# Patient Record
Sex: Male | Born: 1970 | Race: White | Hispanic: No | Smoking: Former smoker
Health system: Southern US, Community
[De-identification: ages and names within clinical notes are randomized; demographics above are authoritative.]

## PROBLEM LIST (undated history)

## (undated) DIAGNOSIS — F32A Depression, unspecified: Secondary | ICD-10-CM

## (undated) DIAGNOSIS — F329 Major depressive disorder, single episode, unspecified: Secondary | ICD-10-CM

## (undated) DIAGNOSIS — Z720 Tobacco use: Secondary | ICD-10-CM

## (undated) DIAGNOSIS — K219 Gastro-esophageal reflux disease without esophagitis: Secondary | ICD-10-CM

## (undated) DIAGNOSIS — F419 Anxiety disorder, unspecified: Secondary | ICD-10-CM

## (undated) DIAGNOSIS — T7840XA Allergy, unspecified, initial encounter: Secondary | ICD-10-CM

## (undated) DIAGNOSIS — I219 Acute myocardial infarction, unspecified: Secondary | ICD-10-CM

## (undated) DIAGNOSIS — I2510 Atherosclerotic heart disease of native coronary artery without angina pectoris: Secondary | ICD-10-CM

## (undated) HISTORY — DX: Acute myocardial infarction, unspecified: I21.9

## (undated) HISTORY — DX: Allergy, unspecified, initial encounter: T78.40XA

## (undated) HISTORY — DX: Gastro-esophageal reflux disease without esophagitis: K21.9

---

## 2015-09-05 ENCOUNTER — Emergency Department (HOSPITAL_COMMUNITY): Payer: Self-pay

## 2015-09-05 ENCOUNTER — Encounter (HOSPITAL_COMMUNITY)
Admission: EM | Disposition: A | Discharge status: Home / Self Care | Payer: Self-pay | Source: Home / Self Care | Attending: Cardiovascular Disease

## 2015-09-05 ENCOUNTER — Encounter (HOSPITAL_COMMUNITY): Payer: Self-pay | Admitting: Emergency Medicine

## 2015-09-05 ENCOUNTER — Inpatient Hospital Stay (HOSPITAL_COMMUNITY)
Admission: EM | Admit: 2015-09-05 | Discharge: 2015-09-07 | DRG: 247 | Disposition: A | Discharge status: Home / Self Care | Payer: Self-pay | Attending: Cardiovascular Disease | Admitting: Cardiovascular Disease

## 2015-09-05 DIAGNOSIS — Z6831 Body mass index (BMI) 31.0-31.9, adult: Secondary | ICD-10-CM

## 2015-09-05 DIAGNOSIS — F17210 Nicotine dependence, cigarettes, uncomplicated: Secondary | ICD-10-CM | POA: Diagnosis present

## 2015-09-05 DIAGNOSIS — Z23 Encounter for immunization: Secondary | ICD-10-CM

## 2015-09-05 DIAGNOSIS — Z955 Presence of coronary angioplasty implant and graft: Secondary | ICD-10-CM

## 2015-09-05 DIAGNOSIS — E669 Obesity, unspecified: Secondary | ICD-10-CM | POA: Diagnosis present

## 2015-09-05 DIAGNOSIS — I255 Ischemic cardiomyopathy: Secondary | ICD-10-CM | POA: Diagnosis present

## 2015-09-05 DIAGNOSIS — Z720 Tobacco use: Secondary | ICD-10-CM | POA: Diagnosis present

## 2015-09-05 DIAGNOSIS — I214 Non-ST elevation (NSTEMI) myocardial infarction: Principal | ICD-10-CM

## 2015-09-05 DIAGNOSIS — Z8249 Family history of ischemic heart disease and other diseases of the circulatory system: Secondary | ICD-10-CM

## 2015-09-05 DIAGNOSIS — I2510 Atherosclerotic heart disease of native coronary artery without angina pectoris: Secondary | ICD-10-CM

## 2015-09-05 DIAGNOSIS — E785 Hyperlipidemia, unspecified: Secondary | ICD-10-CM | POA: Diagnosis present

## 2015-09-05 DIAGNOSIS — I249 Acute ischemic heart disease, unspecified: Secondary | ICD-10-CM

## 2015-09-05 HISTORY — DX: Atherosclerotic heart disease of native coronary artery without angina pectoris: I25.10

## 2015-09-05 HISTORY — PX: CARDIAC CATHETERIZATION: SHX172

## 2015-09-05 HISTORY — DX: Tobacco use: Z72.0

## 2015-09-05 HISTORY — DX: Depression, unspecified: F32.A

## 2015-09-05 HISTORY — DX: Major depressive disorder, single episode, unspecified: F32.9

## 2015-09-05 HISTORY — DX: Anxiety disorder, unspecified: F41.9

## 2015-09-05 LAB — TROPONIN I
TROPONIN I: 0.18 ng/mL — AB (ref <0.031)
Troponin I: >65 ng/mL (ref <0.031)
Troponin I: >65 ng/mL (ref <0.031)

## 2015-09-05 LAB — CBC WITH DIFFERENTIAL/PLATELET
BASOS ABS: 0.1 10*3/uL (ref 0.0–0.1)
BASOS PCT: 1 %
Eosinophils Absolute: 0.2 10*3/uL (ref 0.0–0.7)
Eosinophils Relative: 3 %
HEMATOCRIT: 46.6 % (ref 39.0–52.0)
HEMOGLOBIN: 16.4 g/dL (ref 13.0–17.0)
LYMPHS PCT: 53 %
Lymphs Abs: 4 10*3/uL (ref 0.7–4.0)
MCH: 34.3 pg — ABNORMAL HIGH (ref 26.0–34.0)
MCHC: 35.2 g/dL (ref 30.0–36.0)
MCV: 97.5 fL (ref 78.0–100.0)
MONO ABS: 0.8 10*3/uL (ref 0.1–1.0)
Monocytes Relative: 11 %
NEUTROS ABS: 2.4 10*3/uL (ref 1.7–7.7)
NEUTROS PCT: 32 %
PLATELETS: 196 10*3/uL (ref 150–400)
RBC: 4.78 MIL/uL (ref 4.22–5.81)
RDW: 13.4 % (ref 11.5–15.5)
WBC: 7.5 10*3/uL (ref 4.0–10.5)

## 2015-09-05 LAB — BASIC METABOLIC PANEL
Anion gap: 9 (ref 5–15)
CALCIUM: 9.2 mg/dL (ref 8.9–10.3)
CO2: 24 mmol/L (ref 22–32)
CREATININE: 0.99 mg/dL (ref 0.61–1.24)
Chloride: 104 mmol/L (ref 101–111)
Glucose, Bld: 149 mg/dL — ABNORMAL HIGH (ref 65–99)
Potassium: 3.8 mmol/L (ref 3.5–5.1)
SODIUM: 137 mmol/L (ref 135–145)

## 2015-09-05 LAB — I-STAT TROPONIN, ED: Troponin i, poc: 0 ng/mL (ref 0.00–0.08)

## 2015-09-05 LAB — CBC
HCT: 44.2 % (ref 39.0–52.0)
HEMOGLOBIN: 15.3 g/dL (ref 13.0–17.0)
MCH: 33.8 pg (ref 26.0–34.0)
MCHC: 34.6 g/dL (ref 30.0–36.0)
MCV: 97.8 fL (ref 78.0–100.0)
Platelets: 174 10*3/uL (ref 150–400)
RBC: 4.52 MIL/uL (ref 4.22–5.81)
RDW: 13.5 % (ref 11.5–15.5)
WBC: 8.5 10*3/uL (ref 4.0–10.5)

## 2015-09-05 LAB — PLATELET COUNT: PLATELETS: 172 10*3/uL (ref 150–400)

## 2015-09-05 LAB — BRAIN NATRIURETIC PEPTIDE: B NATRIURETIC PEPTIDE 5: 15.8 pg/mL (ref 0.0–100.0)

## 2015-09-05 LAB — PROTIME-INR
INR: 1.19 (ref 0.00–1.49)
PROTHROMBIN TIME: 15.2 s (ref 11.6–15.2)

## 2015-09-05 LAB — CREATININE, SERUM
Creatinine, Ser: 0.89 mg/dL (ref 0.61–1.24)
GFR calc non Af Amer: >60 mL/min (ref >60)

## 2015-09-05 LAB — POCT ACTIVATED CLOTTING TIME: Activated Clotting Time: 325 seconds

## 2015-09-05 LAB — MRSA PCR SCREENING: MRSA BY PCR: NEGATIVE

## 2015-09-05 SURGERY — LEFT HEART CATH AND CORONARY ANGIOGRAPHY

## 2015-09-05 MED ORDER — INFLUENZA VAC SPLIT QUAD 0.5 ML IM SUSY
0.5000 mL | PREFILLED_SYRINGE | INTRAMUSCULAR | Status: AC
  Administered 2015-09-06: 0.5 mL via INTRAMUSCULAR

## 2015-09-05 MED ORDER — SODIUM CHLORIDE 0.9 % IJ SOLN
3.0000 mL | Freq: Two times a day (BID) | INTRAMUSCULAR | Status: DC
  Administered 2015-09-05 – 2015-09-07 (×3): 3 mL via INTRAVENOUS

## 2015-09-05 MED ORDER — HEPARIN (PORCINE) IN NACL 2-0.9 UNIT/ML-% IJ SOLN
INTRAMUSCULAR | Status: AC
Start: 2015-09-05 — End: 2015-09-05
  Filled 2015-09-05: qty 1000

## 2015-09-05 MED ORDER — ONDANSETRON HCL 4 MG/2ML IJ SOLN: 4.0000 mg | Freq: Four times a day (QID) | INTRAMUSCULAR | Status: DC | PRN

## 2015-09-05 MED ORDER — HEPARIN BOLUS VIA INFUSION
4000.0000 [IU] | Freq: Once | INTRAVENOUS | Status: AC
  Administered 2015-09-05: 4000 [IU] via INTRAVENOUS
  Filled 2015-09-05: qty 4000

## 2015-09-05 MED ORDER — TIROFIBAN HCL IN NACL 5-0.9 MG/100ML-% IV SOLN: 0.1500 ug/kg/min | INTRAVENOUS | Status: AC

## 2015-09-05 MED ORDER — TIROFIBAN HCL IN NACL 5-0.9 MG/100ML-% IV SOLN
INTRAVENOUS | Status: AC
  Filled 2015-09-05: qty 100

## 2015-09-05 MED ORDER — HEPARIN SODIUM (PORCINE) 5000 UNIT/ML IJ SOLN
5000.0000 [IU] | Freq: Three times a day (TID) | INTRAMUSCULAR | Status: DC
  Administered 2015-09-05 – 2015-09-07 (×5): 5000 [IU] via SUBCUTANEOUS
  Filled 2015-09-05 (×5): qty 1

## 2015-09-05 MED ORDER — FENTANYL CITRATE (PF) 100 MCG/2ML IJ SOLN
INTRAMUSCULAR | Status: DC | PRN
  Administered 2015-09-05 (×5): 25 ug via INTRAVENOUS

## 2015-09-05 MED ORDER — TIROFIBAN HCL IN NACL 5-0.9 MG/100ML-% IV SOLN
INTRAVENOUS | Status: DC | PRN
  Administered 2015-09-05: 0.15 ug/kg/min via INTRAVENOUS

## 2015-09-05 MED ORDER — SODIUM CHLORIDE 0.9 % IJ SOLN
3.0000 mL | Freq: Two times a day (BID) | INTRAMUSCULAR | Status: DC
  Administered 2015-09-05 – 2015-09-06 (×2): 3 mL via INTRAVENOUS

## 2015-09-05 MED ORDER — NITROGLYCERIN 1 MG/10 ML FOR IR/CATH LAB
INTRA_ARTERIAL | Status: DC | PRN
  Administered 2015-09-05: 10:00:00

## 2015-09-05 MED ORDER — VERAPAMIL HCL 2.5 MG/ML IV SOLN
INTRAVENOUS | Status: DC | PRN
  Administered 2015-09-05: 09:00:00 via INTRA_ARTERIAL

## 2015-09-05 MED ORDER — METOPROLOL TARTRATE 1 MG/ML IV SOLN
INTRAVENOUS | Status: AC
  Filled 2015-09-05: qty 5

## 2015-09-05 MED ORDER — HEPARIN (PORCINE) IN NACL 100-0.45 UNIT/ML-% IJ SOLN
1350.0000 [IU]/h | INTRAMUSCULAR | Status: DC
  Administered 2015-09-05: 1350 [IU]/h via INTRAVENOUS
  Filled 2015-09-05: qty 250

## 2015-09-05 MED ORDER — ONDANSETRON HCL 4 MG/2ML IJ SOLN
4.0000 mg | Freq: Once | INTRAMUSCULAR | Status: AC
  Administered 2015-09-05: 4 mg via INTRAVENOUS
  Filled 2015-09-05: qty 2

## 2015-09-05 MED ORDER — ATORVASTATIN CALCIUM 80 MG PO TABS: 80.0000 mg | ORAL_TABLET | Freq: Every day | ORAL | Status: DC

## 2015-09-05 MED ORDER — ASPIRIN EC 81 MG PO TBEC: 81.0000 mg | DELAYED_RELEASE_TABLET | Freq: Every day | ORAL | Status: DC

## 2015-09-05 MED ORDER — VERAPAMIL HCL 2.5 MG/ML IV SOLN
INTRAVENOUS | Status: AC
  Filled 2015-09-05: qty 2

## 2015-09-05 MED ORDER — NITROGLYCERIN 1 MG/10 ML FOR IR/CATH LAB
INTRA_ARTERIAL | Status: DC | PRN
  Administered 2015-09-05: 120 ug via INTRA_ARTERIAL
  Administered 2015-09-05: 200 ug via INTRA_ARTERIAL

## 2015-09-05 MED ORDER — ASPIRIN 81 MG PO CHEW
81.0000 mg | CHEWABLE_TABLET | Freq: Every day | ORAL | Status: DC
  Administered 2015-09-06 – 2015-09-07 (×2): 81 mg via ORAL
  Filled 2015-09-05 (×2): qty 1

## 2015-09-05 MED ORDER — MIDAZOLAM HCL 2 MG/2ML IJ SOLN
INTRAMUSCULAR | Status: AC
  Filled 2015-09-05: qty 2

## 2015-09-05 MED ORDER — LIDOCAINE HCL (PF) 1 % IJ SOLN
INTRAMUSCULAR | Status: AC
  Filled 2015-09-05: qty 30

## 2015-09-05 MED ORDER — SODIUM CHLORIDE 0.9 % IJ SOLN: 3.0000 mL | INTRAMUSCULAR | Status: DC | PRN

## 2015-09-05 MED ORDER — SODIUM CHLORIDE 0.9 % IV SOLN: 250.0000 mL | INTRAVENOUS | Status: DC | PRN

## 2015-09-05 MED ORDER — FENTANYL CITRATE (PF) 100 MCG/2ML IJ SOLN
INTRAMUSCULAR | Status: AC
  Filled 2015-09-05: qty 2

## 2015-09-05 MED ORDER — SODIUM CHLORIDE 0.9 % WEIGHT BASED INFUSION: 1.0000 mL/kg/h | INTRAVENOUS | Status: AC

## 2015-09-05 MED ORDER — IOHEXOL 350 MG/ML SOLN
INTRAVENOUS | Status: DC | PRN
  Administered 2015-09-05: 165 mL via INTRA_ARTERIAL

## 2015-09-05 MED ORDER — TICAGRELOR 90 MG PO TABS
ORAL_TABLET | ORAL | Status: AC
  Filled 2015-09-05: qty 1

## 2015-09-05 MED ORDER — HEPARIN SODIUM (PORCINE) 1000 UNIT/ML IJ SOLN
INTRAMUSCULAR | Status: DC | PRN
  Administered 2015-09-05 (×2): 5000 [IU] via INTRAVENOUS

## 2015-09-05 MED ORDER — TICAGRELOR 90 MG PO TABS
ORAL_TABLET | ORAL | Status: DC | PRN
  Administered 2015-09-05: 180 mg via ORAL

## 2015-09-05 MED ORDER — METOPROLOL TARTRATE 1 MG/ML IV SOLN
INTRAVENOUS | Status: DC | PRN
  Administered 2015-09-05: 5 mg via INTRAVENOUS

## 2015-09-05 MED ORDER — ATORVASTATIN CALCIUM 80 MG PO TABS
80.0000 mg | ORAL_TABLET | ORAL | Status: DC
  Administered 2015-09-05: 80 mg via ORAL
  Filled 2015-09-05: qty 1

## 2015-09-05 MED ORDER — ACETAMINOPHEN 325 MG PO TABS: 650.0000 mg | ORAL_TABLET | ORAL | Status: DC | PRN

## 2015-09-05 MED ORDER — TICAGRELOR 90 MG PO TABS
90.0000 mg | ORAL_TABLET | Freq: Two times a day (BID) | ORAL | Status: DC
  Administered 2015-09-05 – 2015-09-07 (×4): 90 mg via ORAL
  Filled 2015-09-05 (×4): qty 1

## 2015-09-05 MED ORDER — TIROFIBAN (AGGRASTAT) BOLUS VIA INFUSION
INTRAVENOUS | Status: DC | PRN
  Administered 2015-09-05: 2552.5 ug via INTRAVENOUS

## 2015-09-05 MED ORDER — MORPHINE SULFATE (PF) 4 MG/ML IV SOLN
4.0000 mg | Freq: Once | INTRAVENOUS | Status: AC
  Administered 2015-09-05: 4 mg via INTRAVENOUS
  Filled 2015-09-05: qty 1

## 2015-09-05 MED ORDER — ENSURE ENLIVE PO LIQD
237.0000 mL | Freq: Two times a day (BID) | ORAL | Status: DC
  Administered 2015-09-07: 237 mL via ORAL

## 2015-09-05 MED ORDER — MIDAZOLAM HCL 2 MG/2ML IJ SOLN
INTRAMUSCULAR | Status: DC | PRN
  Administered 2015-09-05: 1 mg via INTRAVENOUS
  Administered 2015-09-05: 2 mg via INTRAVENOUS
  Administered 2015-09-05 (×3): 1 mg via INTRAVENOUS

## 2015-09-05 MED ORDER — SODIUM CHLORIDE 0.9 % IV SOLN: INTRAVENOUS | Status: DC

## 2015-09-05 MED ORDER — NITROGLYCERIN 0.4 MG SL SUBL
0.4000 mg | SUBLINGUAL_TABLET | SUBLINGUAL | Status: DC | PRN
  Administered 2015-09-05 (×3): 0.4 mg via SUBLINGUAL
  Filled 2015-09-05: qty 1

## 2015-09-05 MED ORDER — NITROGLYCERIN 0.4 MG SL SUBL: 0.4000 mg | SUBLINGUAL_TABLET | SUBLINGUAL | Status: DC | PRN

## 2015-09-05 MED ORDER — PNEUMOCOCCAL VAC POLYVALENT 25 MCG/0.5ML IJ INJ
0.5000 mL | INJECTION | INTRAMUSCULAR | Status: AC
  Administered 2015-09-06: 0.5 mL via INTRAMUSCULAR

## 2015-09-05 MED ORDER — HEPARIN SODIUM (PORCINE) 1000 UNIT/ML IJ SOLN
INTRAMUSCULAR | Status: AC
  Filled 2015-09-05: qty 1

## 2015-09-05 MED ORDER — SODIUM CHLORIDE 0.9 % IJ SOLN
3.0000 mL | INTRAMUSCULAR | Status: DC | PRN
Start: 2015-09-05 — End: 2015-09-06

## 2015-09-05 MED ORDER — METOPROLOL TARTRATE 12.5 MG HALF TABLET
12.5000 mg | ORAL_TABLET | Freq: Two times a day (BID) | ORAL | Status: DC
  Administered 2015-09-05 – 2015-09-07 (×5): 12.5 mg via ORAL
  Filled 2015-09-05 (×5): qty 1

## 2015-09-05 MED ORDER — NITROGLYCERIN IN D5W 200-5 MCG/ML-% IV SOLN
0.0000 ug/min | Freq: Once | INTRAVENOUS | Status: AC
  Administered 2015-09-05: 5 ug/min via INTRAVENOUS
  Filled 2015-09-05: qty 250

## 2015-09-05 SURGICAL SUPPLY — 22 items
BALLN EMERGE MR 2.5X12 (BALLOONS) ×4
BALLN ~~LOC~~ TREK RX 4.0X8 (BALLOONS) ×4
BALLOON EMERGE MR 2.5X12 (BALLOONS) ×2 IMPLANT
BALLOON ~~LOC~~ TREK RX 4.0X8 (BALLOONS) ×2 IMPLANT
CATH EXTRAC PRONTO 5.5F 138CM (CATHETERS) ×4 IMPLANT
CATH INFINITI 5 FR JL3.5 (CATHETERS) ×4 IMPLANT
CATH INFINITI 5FR ANG PIGTAIL (CATHETERS) ×4 IMPLANT
CATH INFINITI JR4 5F (CATHETERS) ×4 IMPLANT
DEVICE RAD COMP TR BAND LRG (VASCULAR PRODUCTS) ×4 IMPLANT
GLIDESHEATH SLEND SS 6F .021 (SHEATH) ×4 IMPLANT
GUIDE CATH RUNWAY 6FR CLS3 (CATHETERS) ×4 IMPLANT
KIT ENCORE 26 ADVANTAGE (KITS) ×4 IMPLANT
KIT HEART LEFT (KITS) ×4 IMPLANT
PACK CARDIAC CATHETERIZATION (CUSTOM PROCEDURE TRAY) ×4 IMPLANT
STENT SYNERGY DES 3.5X16 (Permanent Stent) ×4 IMPLANT
SYR MEDRAD MARK V 150ML (SYRINGE) ×4 IMPLANT
TRANSDUCER W/STOPCOCK (MISCELLANEOUS) ×4 IMPLANT
TUBING CIL FLEX 10 FLL-RA (TUBING) ×4 IMPLANT
WIRE ASAHI PROWATER 180CM (WIRE) ×4 IMPLANT
WIRE HI TORQ BMW 190CM (WIRE) ×4 IMPLANT
WIRE HI TORQ VERSACORE-J 145CM (WIRE) ×4 IMPLANT
WIRE SAFE-T 1.5MM-J .035X260CM (WIRE) ×4 IMPLANT

## 2015-09-05 NOTE — H&P (Signed)
History and Physical  Patient ID: Chad Copeland MRN: [id number], SOB: [birth year] 44 y.o. Date of Encounter: 09/05/2015, 7:42 AM  Primary Physician: No primary care provider on file. Primary Cardiologist: new Excell Seltzer)  Chief Complaint: Chest pain  HPI: 44 y.o. male w/ PMHx significant for depression, tobacco abuse who presented to [facility] on 09/05/2015 with complaints of chest pain.  The patient has no history of cardiac disease. He awoke at 4 AM today with substernal chest pressure and tingling in his arms. He has never had symptoms like this before. He has been in his usual state of health over the last few weeks, with the exception of sinus congestion that he related to seasonal allergies. Associated symptoms this morning included nausea and diaphoresis. There is no shortness of breath, lightheadedness, or syncope. EMS was called and he was brought to the emergency department. EKG is suggestive of ischemia but was not diagnostic for STEMI. The patient was started on IV heparin and nitroglycerin. His pain initially was rated at 6/10 and now is rated at 2/10. He does have ongoing discomfort at the time of my interview. He describes his chest pain as a "pressure." There is no radiation of pain into the neck, jaw, or shoulders.   Past Medical History  Diagnosis Date  . Depression   . Anxiety      Surgical History: History reviewed. No pertinent past surgical history.   Home Meds: Prior to Admission medications   Not on File    Allergies: No Known Allergies  Social History   Social History  . Marital Status: Divorced    Spouse Name: N/A  . Number of Children: N/A  . Years of Education: N/A   Occupational History  . Not on file.   Social History Main Topics  . Smoking status: Current Every Day Smoker -- 1.00 packs/day    Types: Cigarettes  . Smokeless tobacco: Not on file  . Alcohol Use: 3.6 oz/week    6 Cans of beer per week  . Drug Use: No  . Sexual  Activity: Not on file   Other Topics Concern  . Not on file   Social History Narrative  . No narrative on file     Grandfather on paternal side had multivessel CABG at age 7. No other family history of CAD.  Review of Systems: General: negative for weight changes.  ENT: negative for rhinorrhea or epistaxis Cardiovascular: See history of present illness Dermatological: negative for rash Respiratory: negative for cough or wheezing GI: negative for vomiting, diarrhea, bright red blood per rectum, melena, or hematemesis GU: no hematuria, urgency, or frequency Neurologic: negative for visual changes, syncope, headache, or dizziness Heme: no easy bruising or bleeding Endo: negative for excessive thirst, thyroid disorder, or flushing Musculoskeletal: negative for joint pain or swelling, negative for myalgias All other systems reviewed and are otherwise negative except as noted above.  Physical Exam: Blood pressure 138/95, pulse 83, temperature 98 F (36.7 C), temperature source Oral, resp. rate 17, height 5' 10.5" (1.791 m), weight 225 lb (102.059 kg), SpO2 100 %. General: Well developed, well nourished, pleasant overweight male, alert and oriented, in no acute distress. HEENT: Normocephalic, atraumatic, sclera anicteric Neck: Supple. Carotids 2+ without bruits. JVP normal Lungs: Clear bilaterally to auscultation without wheezes, rales, or rhonchi. Breathing is unlabored. Heart: RRR with normal S1 and S2. No murmurs, rubs, or gallops appreciated. Abdomen: Soft, non-tender, non-distended with normoactive bowel sounds. No hepatomegaly. No rebound/guarding. No obvious abdominal  masses. Back: No CVA tenderness Msk:  Strength and tone appear normal for age. Extremities: No clubbing, cyanosis, or edema.  Distal pedal pulses are 2+ and equal bilaterally. Neuro: CNII-XII intact, moves all extremities spontaneously. Psych:  Responds to questions appropriately with a normal affect. Skin: warm  and dry without rash   Labs:   Lab Results  Component Value Date   WBC 7.5 09/05/2015   HGB 16.4 09/05/2015   HCT 46.6 09/05/2015   MCV 97.5 09/05/2015   PLT 196 09/05/2015    Recent Labs Lab 09/05/15 0527  NA 137  K 3.8  CL 104  CO2 24  BUN <5*  CREATININE 0.99  CALCIUM 9.2  GLUCOSE 149*    Recent Labs  09/05/15 0527  TROPONINI 0.18*   No results found for: CHOL, HDL, LDLCALC, TRIG No results found for: DDIMER  Radiology/Studies:  Dg Chest 2 View  09/05/2015  CLINICAL DATA:  Acute onset of cough and shortness of breath. Generalized chest pain. Follow-up chest radiograph suggested to assess for airspace opacification. Initial encounter. EXAM: CHEST  2 VIEW COMPARISON:  Chest radiograph performed earlier today at 4:53 a.m. FINDINGS: The lungs are well-aerated and clear. There is no evidence of focal opacification, pleural effusion or pneumothorax. The heart is normal in size; the mediastinal contour is within normal limits. No acute osseous abnormalities are seen. IMPRESSION: No acute cardiopulmonary process seen. Electronically Signed   By: Roanna RaiderJeffery  Chang M.D.   On: 09/05/2015 06:29   Dg Chest Port 1 View  09/05/2015  CLINICAL DATA:  Initial evaluation for acute chest pain with shortness of breath, cough, congestion. EXAM: PORTABLE CHEST 1 VIEW COMPARISON:  None. FINDINGS: Cardiac and mediastinal silhouettes are within normal limits. Tracheal air column midline and patent. Lungs are mildly hypoinflated. Secondary mild bronchovascular crowding. Minimal patchy bibasilar opacities favored to reflect atelectasis/ bronchovascular crowding. No definite focal infiltrates identified. No pulmonary edema or pleural effusion. No pneumothorax. No acute osseus abnormality. IMPRESSION: Shallow lung inflation with associated bibasilar patchy opacities. Findings favored to reflect atelectasis/ bronchovascular crowding. No definite focal infiltrates identified. If there is clinical concern for  possible occult infection at either lung base, correlation with a follow-up lateral radiograph may be helpful for further evaluation. Electronically Signed   By: Rise MuBenjamin  McClintock M.D.   On: 09/05/2015 05:18     EKG: NSR, ST-T changes consider anteroseptal ischemia. Slight inferolateral ST elevation (<1 mm) consider acute injury  CARDIAC STUDIES: pending  ASSESSMENT AND PLAN:  1. Acute non-ST elevation MI: The patient has been appropriately started on heparin and nitroglycerin. I have reviewed his serial EKGs. I suspect inferolateral MI, but EKG does not meet criteria for STEMI. He does have ongoing discomfort and I have recommended emergency cardiac catheterization and possible PCI. I have reviewed the risks, indications, and alternatives to cardiac catheterization and PCI with the patient. Risks include but are not limited to bleeding, infection, vascular injury, stroke, myocardial infection, arrhythmia, kidney injury, radiation-related injury in the case of prolonged fluoroscopy use, emergency cardiac surgery, and death. The patient understands the risks of serious complication is low (<1%). He will be continued on heparin and NTG, await cath results for further disposition.   2. Tobacco abuse: cessation counseling done.   3. Obesity: lifestyle modification, check lipids, start high-intensity statin drug.  Enzo BiSigned, Briseyda Fehr, Fredrico MD 09/05/2015, 7:42 AM

## 2015-09-05 NOTE — Progress Notes (Signed)
CRITICAL VALUE ALERT  Critical value received:  Troponin >65  Date of notification:  09/05/15  Time of notification:  1532  Critical value read back:Yes.    Nurse who received alert:  Peyton Bottomsachel R Mikell Camp, RN  MD notified (1st page):  Dr. Eldridge DaceVaranasi  Time of first page:  1613  MD notified (2nd page):  Time of second page:  Responding MD:  Dr. Eldridge DaceVaranasi  Time MD responded:  [phone]

## 2015-09-05 NOTE — Care Management Note (Signed)
Case Management Note  Patient Details  Name: Chad Copeland MRN: [id number] Date of Birth: 1971/05/31  Subjective/Objective:     Adm w mi               Action/Plan: lives alone   Expected Discharge Date:                  Expected Discharge Plan:  Home/Self Care  In-House Referral:     Discharge planning Services  CM Consult, Medication Assistance, Indigent Health Clinic  Post Acute Care Choice:    Choice offered to:     DME Arranged:    DME Agency:     HH Arranged:    HH Agency:     Status of Service:     Medicare Important Message Given:    Date Medicare IM Given:    Medicare IM give by:    Date Additional Medicare IM Given:    Additional Medicare Important Message give by:     If discussed at Long Length of Stay Meetings, dates discussed:    Additional Comments: no ins . Gave pt inform on [facility] and [facility]. Gave pt 30day free brilinta card. Placed brilinta pt assist form on shadow chart. Pt aware will need to send in w proof of income.  Hanley Haysowell, Donley Harland T, RN 09/05/2015, 10:52 AM

## 2015-09-05 NOTE — Progress Notes (Signed)
ANTICOAGULATION CONSULT NOTE - Initial Consult  Pharmacy Consult for Heparin Indication: chest pain/ACS  No Known Allergies  Patient Measurements: Height: 5' 10.5" (179.1 cm) Weight: 225 lb (102.059 kg) IBW/kg (Calculated) : 74.15 Heparin Dosing Weight: 95 kg  Vital Signs: Temp: 98 F (36.7 C) (11/22 0444) Temp Source: Oral (11/22 0444) BP: 134/96 mmHg (11/22 0600) Pulse Rate: 80 (11/22 0600)  Labs:  Recent Labs  09/05/15 0527  HGB 16.4  HCT 46.6  PLT 196  CREATININE 0.99  TROPONINI 0.18*    Estimated Creatinine Clearance: 113.8 mL/min (by C-G formula based on Cr of 0.99).   Medical History: Past Medical History  Diagnosis Date  . Depression   . Anxiety     Medications:  No PTA meds  Assessment: 44 y.o. M presents with CP. To begin heparin for ACS. Trop 0.18. CBC stable at baseline.  Goal of Therapy:  Heparin level 0.3-0.7 units/ml Monitor platelets by anticoagulation protocol: Yes   Plan:  Heparin IV bolus 4000 units Heparin gtt at 1350 units/hr Will f/u heparin level in 6 hours Daily heparin level and CBC  Christoper Fabianaron Signe Tackitt, PharmD, BCPS Clinical pharmacist, pager [phone] 09/05/2015,6:23 AM

## 2015-09-05 NOTE — Progress Notes (Signed)
CRITICAL VALUE ALERT  Critical value received:  Troponin   Date of notification:  09/05/15  Time of notification:  1100  Critical value read back: yes  Nurse who received alert: P. Renae GlossShelton  MD notified (1st page):  Dr Harl BowieVarnansi  Time of first page:    MD notified (2nd page):  Time of second page:  Responding MD: expected lab value  Time MD responded:

## 2015-09-05 NOTE — Interval H&P Note (Signed)
Cath Lab Visit (complete for each Cath Lab visit)  Clinical Evaluation Leading to the Procedure:   ACS: Yes.    Non-ACS:    Anginal Classification: CCS IV  Anti-ischemic medical therapy: 1 medication  Non-Invasive Test Results: No non-invasive testing performed  Prior CABG: No previous CABG  No bleeding issues or planned surgery.  Patient brought urgently to the cath lab due to ongoing pain.    History and Physical Interval Note:  09/05/2015 8:24 AM  Chad Copeland  has presented today for surgery, with the diagnosis of cp  The various methods of treatment have been discussed with the patient and family. After consideration of risks, benefits and other options for treatment, the patient has consented to  Procedure(s): Left Heart Cath and Coronary Angiography (N/A) as a surgical intervention .  The patient's history has been reviewed, patient examined, no change in status, stable for surgery.  I have reviewed the patient's chart and labs.  Questions were answered to the patient's satisfaction.     Chad Copeland S.

## 2015-09-05 NOTE — ED Provider Notes (Signed)
CSN: [id number]     Arrival date & time 09/05/15  [id number] History   First MD Initiated Contact with Patient 09/05/15 [phone]     Chief Complaint  Patient presents with  . Chest Pain  . Shortness of Breath     (Consider location/radiation/quality/duration/timing/severity/associated sxs/prior Treatment) HPI Comments: Presents to the emergency department for evaluation of chest pain. Patient reports that he was awakened from sleep approximately an hour ago. Patient noticed that he had discomfort and tightness in the center of his chest. He then noted nausea, diaphoresis and "cold sweats. He feels short of breath. He comes to the ER by ambulance. He has been given aspirin and nitroglycerin. Patient reports improvement but not complete resolution of his discomfort with nitroglycerin.  Patient has had nasal congestion and nonproductive cough over the last couple of days. He denies any history of asthma or COPD.  Patient denies previous cardiac history. He reports that his blood pressure has been high the last few times it has been checked, but has never been treated for hypertension. He is unaware of his cholesterol status. He does not have diabetes. He reports that one of his grandfathers had coronary artery disease, no early or close relative CAD, however.  Patient is a 44 y.o. male presenting with chest pain and shortness of breath.  Chest Pain Associated symptoms: cough, diaphoresis, nausea and shortness of breath   Shortness of Breath Associated symptoms: chest pain, cough and diaphoresis     Past Medical History  Diagnosis Date  . Depression   . Anxiety    History reviewed. No pertinent past surgical history. History reviewed. No pertinent family history. Social History  Substance Use Topics  . Smoking status: Current Every Day Smoker -- 1.00 packs/day    Types: Cigarettes  . Smokeless tobacco: None  . Alcohol Use: 3.6 oz/week    6 Cans of beer per week    Review of Systems   Constitutional: Positive for diaphoresis.  Respiratory: Positive for cough and shortness of breath.   Cardiovascular: Positive for chest pain.  Gastrointestinal: Positive for nausea.  All other systems reviewed and are negative.     Allergies  Review of patient's allergies indicates no known allergies.  Home Medications   Prior to Admission medications   Not on File   BP 138/95 mmHg  Pulse 83  Temp(Src) 98 F (36.7 C) (Oral)  Resp 17  Ht 5' 10.5" (1.791 m)  Wt 225 lb (102.059 kg)  BMI 31.82 kg/m2  SpO2 100% Physical Exam  Constitutional: He is oriented to person, place, and time. He appears well-developed and well-nourished. No distress.  HENT:  Head: Normocephalic and atraumatic.  Right Ear: Hearing normal.  Left Ear: Hearing normal.  Nose: Nose normal.  Mouth/Throat: Oropharynx is clear and moist and mucous membranes are normal.  Eyes: Conjunctivae and EOM are normal. Pupils are equal, round, and reactive to light.  Neck: Normal range of motion. Neck supple.  Cardiovascular: Regular rhythm, S1 normal and S2 normal.  Exam reveals no gallop and no friction rub.   No murmur heard. Pulmonary/Chest: Effort normal. No respiratory distress. He has rhonchi in the right lower field. He exhibits no tenderness.  Abdominal: Soft. Normal appearance and bowel sounds are normal. There is no hepatosplenomegaly. There is no tenderness. There is no rebound, no guarding, no tenderness at McBurney's point and negative Murphy's sign. No hernia.  Musculoskeletal: Normal range of motion.  Neurological: He is alert and oriented to person, place, and time.  He has normal strength. No cranial nerve deficit or sensory deficit. Coordination normal. GCS eye subscore is 4. GCS verbal subscore is 5. GCS motor subscore is 6.  Skin: Skin is warm, dry and intact. No rash noted. No cyanosis.  Psychiatric: He has a normal mood and affect. His speech is normal and behavior is normal. Thought content normal.   Nursing note and vitals reviewed.   ED Course  Procedures (including critical care time) Labs Review Labs Reviewed  CBC WITH DIFFERENTIAL/PLATELET - Abnormal; Notable for the following:    MCH 34.3 (*)    All other components within normal limits  BASIC METABOLIC PANEL - Abnormal; Notable for the following:    Glucose, Bld 149 (*)    BUN <5 (*)    All other components within normal limits  TROPONIN I - Abnormal; Notable for the following:    Troponin I 0.18 (*)    All other components within normal limits  BRAIN NATRIURETIC PEPTIDE  HEPARIN LEVEL (UNFRACTIONATED)  Rosezena Sensor, ED    Imaging Review Dg Chest 2 View  09/05/2015  CLINICAL DATA:  Acute onset of cough and shortness of breath. Generalized chest pain. Follow-up chest radiograph suggested to assess for airspace opacification. Initial encounter. EXAM: CHEST  2 VIEW COMPARISON:  Chest radiograph performed earlier today at 4:53 a.m. FINDINGS: The lungs are well-aerated and clear. There is no evidence of focal opacification, pleural effusion or pneumothorax. The heart is normal in size; the mediastinal contour is within normal limits. No acute osseous abnormalities are seen. IMPRESSION: No acute cardiopulmonary process seen. Electronically Signed   By: Roanna Raider M.D.   On: 09/05/2015 06:29   Dg Chest Port 1 View  09/05/2015  CLINICAL DATA:  Initial evaluation for acute chest pain with shortness of breath, cough, congestion. EXAM: PORTABLE CHEST 1 VIEW COMPARISON:  None. FINDINGS: Cardiac and mediastinal silhouettes are within normal limits. Tracheal air column midline and patent. Lungs are mildly hypoinflated. Secondary mild bronchovascular crowding. Minimal patchy bibasilar opacities favored to reflect atelectasis/ bronchovascular crowding. No definite focal infiltrates identified. No pulmonary edema or pleural effusion. No pneumothorax. No acute osseus abnormality. IMPRESSION: Shallow lung inflation with associated  bibasilar patchy opacities. Findings favored to reflect atelectasis/ bronchovascular crowding. No definite focal infiltrates identified. If there is clinical concern for possible occult infection at either lung base, correlation with a follow-up lateral radiograph may be helpful for further evaluation. Electronically Signed   By: Rise Mu M.D.   On: 09/05/2015 05:18   I have personally reviewed and evaluated these images and lab results as part of my medical decision-making.   EKG Interpretation   Date/Time:  Tuesday September 05 2015 04:40:09 EST Ventricular Rate:  80 PR Interval:  145 QRS Duration: 79 QT Interval:  408 QTC Calculation: 471 R Axis:   57 Text Interpretation:  Sinus rhythm Abnormal R-wave progression, early  transition Borderline repolarization abnormality Borderline ST elevation,  lateral leads No previous tracing Confirmed by Ajaya Crutchfield  MD, Rhea Thrun  [phone]) on 09/05/2015 4:48:37 AM    EKG Interpretation  Date/Time:  Tuesday September 05 2015 05:07:40 EST Ventricular Rate:  84 PR Interval:  149 QRS Duration: 79 QT Interval:  391 QTC Calculation: 462 R Axis:   42 Text Interpretation:  Sinus rhythm Abnormal R-wave progression, early transition Borderline repolarization abnormality Minimal ST elevation, inferior leads minimal ST depression septal leads No significant change since last tracing Confirmed by Yazan Gatling  MD, Tyla Burgner [phone]) on 09/05/2015 5:10:34 AM  MDM   Final diagnoses:  ACS (acute coronary syndrome) (HCC)   Presents to the emergency department for evaluation of chest pain. Patient had acute onset of chest pain and hour before arriving to the ER. Pain awakened him from sleep. He had concomitant nausea, diaphoresis and shortness of breath. Patient appeared slightly uncomfortable at arrival. He was administered aspirin and nitroglycerin by EMS. Pain improved but did not resolve with nitroglycerin. He was starting to have  increased pain again here in the ER, was administered nitroglycerin with improvement.  Patient's EKG at arrival was not diagnostic. Was concerning. He does have lateral ST elevations which were felt to be early repolarization. He also, however, has depressions in V2 and V3 with prominent R waves, possibly suggestive of posterior injury. This was discussed with cardiology fellow, Dr. Zachery Conch at arrival. He did review the EKG and felt that the patient could be worked up with troponins because EKG was not diagnostic. Patient did, however, ultimately have a positive troponin. Case discussed with Dr. Excell Seltzer, on call for cardiology. He will see the patient in the ER for management. Patient initiated on heparin and nitroglycerin.  CRITICAL CARE Performed by: Gilda Crease   Total critical care time: 30 minutes  Critical care time was exclusive of separately billable procedures and treating other patients.  Critical care was necessary to treat or prevent imminent or life-threatening deterioration.  Critical care was time spent personally by me on the following activities: development of treatment plan with patient and/or surrogate as well as nursing, discussions with consultants, evaluation of patient's response to treatment, examination of patient, obtaining history from patient or surrogate, ordering and performing treatments and interventions, ordering and review of laboratory studies, ordering and review of radiographic studies, pulse oximetry and re-evaluation of patient's condition.      Gilda Crease, MD 09/05/15 [phone]

## 2015-09-05 NOTE — ED Notes (Signed)
Pt brought to ED by [facility] from home for mid CP and SOB with some nausea, pt states CP is 4/10 after 2 nitros given by [facility] 325 mg of Aspirin and 4 mg of Zofran IV. Vitals for [facility] BP145/100, HR 78, 96% on RA.

## 2015-09-05 NOTE — ED Notes (Signed)
Patient transported to X-ray 

## 2015-09-05 NOTE — Progress Notes (Signed)
Right radial TR band removed and pressure dressing applied. Level 1 due to a small amount of bruising.

## 2015-09-06 ENCOUNTER — Inpatient Hospital Stay (HOSPITAL_COMMUNITY): Payer: Self-pay

## 2015-09-06 DIAGNOSIS — I2510 Atherosclerotic heart disease of native coronary artery without angina pectoris: Secondary | ICD-10-CM

## 2015-09-06 LAB — CBC
HEMATOCRIT: 45.4 % (ref 39.0–52.0)
HEMOGLOBIN: 15.9 g/dL (ref 13.0–17.0)
MCH: 33.8 pg (ref 26.0–34.0)
MCHC: 35 g/dL (ref 30.0–36.0)
MCV: 96.4 fL (ref 78.0–100.0)
Platelets: 175 10*3/uL (ref 150–400)
RBC: 4.71 MIL/uL (ref 4.22–5.81)
RDW: 13.4 % (ref 11.5–15.5)
WBC: 8.1 10*3/uL (ref 4.0–10.5)

## 2015-09-06 LAB — BASIC METABOLIC PANEL
Anion gap: 9 (ref 5–15)
BUN: 5 mg/dL — AB (ref 6–20)
CALCIUM: 9.2 mg/dL (ref 8.9–10.3)
CHLORIDE: 103 mmol/L (ref 101–111)
CO2: 24 mmol/L (ref 22–32)
Creatinine, Ser: 0.96 mg/dL (ref 0.61–1.24)
GFR calc Af Amer: >60 mL/min (ref >60)
GLUCOSE: 102 mg/dL — AB (ref 65–99)
POTASSIUM: 3.7 mmol/L (ref 3.5–5.1)
Sodium: 136 mmol/L (ref 135–145)

## 2015-09-06 LAB — LIPID PANEL
Cholesterol: 150 mg/dL (ref 0–200)
HDL: 46 mg/dL (ref >40)
LDL CALC: 90 mg/dL (ref 0–99)
TRIGLYCERIDES: 71 mg/dL (ref <150)
Total CHOL/HDL Ratio: 3.3 RATIO
VLDL: 14 mg/dL (ref 0–40)

## 2015-09-06 LAB — TROPONIN I: TROPONIN I: 57.07 ng/mL — AB (ref <0.031)

## 2015-09-06 LAB — HEMOGLOBIN A1C
HEMOGLOBIN A1C: 5.7 % — AB (ref 4.8–5.6)
Mean Plasma Glucose: 117 mg/dL

## 2015-09-06 MED ORDER — ATORVASTATIN CALCIUM 80 MG PO TABS: 80.0000 mg | ORAL_TABLET | Freq: Every day | ORAL | Status: DC

## 2015-09-06 MED ORDER — LISINOPRIL 5 MG PO TABS
5.0000 mg | ORAL_TABLET | Freq: Every day | ORAL | Status: DC
  Administered 2015-09-06 – 2015-09-07 (×2): 5 mg via ORAL
  Filled 2015-09-06 (×2): qty 1

## 2015-09-06 NOTE — Research (Signed)
TWILIGHT Informed Consent   Subject Name: Chad Copeland  Subject met inclusion and exclusion criteria.  The informed consent form, study requirements and expectations were reviewed with the subject and questions and concerns were addressed prior to the signing of the consent form.  The subject verbalized understanding of the trail requirements.  The subject agreed to participate in the TWILIGHT trial and signed the informed consent.  The informed consent was obtained prior to performance of any protocol-specific procedures for the subject.  A copy of the signed informed consent was given to the subject and a copy was placed in the subject's medical record.  Chad Bathe Jr. 09/06/2015, 1130 am

## 2015-09-06 NOTE — Progress Notes (Signed)
Nutrition Brief Note  Patient identified on the Malnutrition Screening Tool (MST) Report  Pt reports having a decreased appetite prior to admission but no weight loss. Pt reports he feels a lot better and is eating great now.   Wt Readings from Last 15 Encounters:  09/05/15 223 lb 1.7 oz (101.2 kg)    Body mass index is 32.01 kg/(m^2). Patient meets criteria for obesity class I based on current BMI.   Current diet order is Heart Health, patient is consuming approximately 100% of meals at this time. Labs and medications reviewed.   No nutrition interventions warranted at this time. If nutrition issues arise, please consult RD.   Kendell BaneHeather Milad Bublitz RD, LDN, CNSC [phone] Pager [phone] After Hours Pager

## 2015-09-06 NOTE — Progress Notes (Signed)
    Subjective:  Doing well this morning. Some residual chest soreness into yesterday evening. Resolved now. Denies dyspnea or chest pain. Tolerating PO intake without nausea or vomiting.  Objective:  Vital Signs in the last 24 hours: Temp:  [98 F (36.7 C)-99 F (37.2 C)] 98.5 F (36.9 C) (11/23 0400) Pulse Rate:  [0-114] 86 (11/23 0300) Resp:  [0-31] 14 (11/23 0300) BP: (107-141)/(70-97) 113/78 mmHg (11/23 0300) SpO2:  [0 %-100 %] 95 % (11/23 0300) Weight:  [223 lb 1.7 oz (101.2 kg)] 223 lb 1.7 oz (101.2 kg) (11/22 1000)  Intake/Output from previous day: 11/22 0701 - 11/23 0700 In: 560.9 [P.O.:240; I.V.:320.9] Out: 2750 [Urine:2750]  Physical Exam: Pt is alert and oriented, NAD HEENT: normal Neck: JVP - normal, carotids 2+ without bruits Lungs: CTA bilaterally CV: RRR without murmur or gallop Abd: soft, NT, Positive BS, no hepatomegaly Ext: no C/C/E, distal pulses intact and equal Skin: warm/dry no rash   Lab Results:  Recent Labs  09/05/15 1101 09/05/15 1420 09/06/15 0310  WBC 8.5  --  8.1  HGB 15.3  --  15.9  PLT 174 172 175    Recent Labs  09/05/15 0527 09/05/15 1101 09/06/15 0310  NA 137  --  136  K 3.8  --  3.7  CL 104  --  103  CO2 24  --  24  GLUCOSE 149*  --  102*  BUN <5*  --  5*  CREATININE 0.99 0.89 0.96    Recent Labs  09/05/15 1420 09/05/15 1822  TROPONINI >65.00* >65.00*    Cardiac Studies: Cath 11/22  1st Mrg lesion, 100% stenosed. Post intervention, there is a 0% residual stenosis. The vessel was treated with a 3.5 x 16 Synergy drug-eluting stent, postdilated to greater than 4 mm in diameter.  There is mild to moderate left ventricular systolic dysfunction.  There is No left main. There are separate ostia of the LAD and circumflex.  Tele: NSR, PVC  Assessment/Plan:  44 year old man with history of depression and tobacco abuse presented 09/05/2015 with chest pain.   NSTEMI, CAD: EKG with ST-T changes concerning for  anteroseptal ischemia. Proceeded to cath lab 11/22 and underwent DES to 1st marginal lesion. Troponin initially 0.18, increased to >65. Repeat EKG this morning with Q waves and T wave inversions in lateral leads. -Check troponin -Echo -ASA 81mg  daily, Brilinta 90mg  BID -Atorvastatin 80mg  daily -metoprolol 12.5mg  BID -Start lisinopril 5mg  daily  Prediabetes: Hgb A1c 5.7%  Tobacco abuse: cessation counseling  Obesity: lifestyle modification  Transfer to telemetry  Griffin BasilKrall, Jennifer, M.D. 09/06/2015, 7:23 AM    Patient seen, examined. Available data reviewed. Agree with findings, assessment, and plan as outlined by Dr Isabella BowensKrall. Pt doing very well today. Exam with heart RRR, lungs CTA, no edema. Meds reviewed and appropriate. Explained rationale for meds to patient and his mother who is at bedside. He will need to be off work x 2 weeks. Tx tele and anticipate DC tomorrow. Await 2D echo.  Tonny BollmanMichael Naresh Althaus, M.D. 09/06/2015 2:13 PM

## 2015-09-06 NOTE — Progress Notes (Signed)
Echocardiogram 2D Echocardiogram has been performed.  Chad Copeland, Chad Copeland M 09/06/2015, 12:51 PM

## 2015-09-06 NOTE — Progress Notes (Signed)
CARDIAC REHAB PHASE I   PRE:  Rate/Rhythm: 90 SR    BP: sitting 116/81    SaO2:   MODE:  Ambulation: 340 ft   POST:  Rate/Rhythm: 94 SR    BP: sitting 116/91     SaO2:   Tolerated well. No c/o. Ed completed. Voiced understanding and determined to quit smoking. Gave resources and discussed importance of Brilinta. Will refer to [place] CRPII. Encouraged more walking. [id number]   Elissa LovettReeve, Nusrat Encarnacion IrvonaKristan CES, ACSM 09/06/2015 2:25 PM

## 2015-09-06 NOTE — Research (Signed)
[facility].  Research will provide the Ticagrelor and aspirin.

## 2015-09-07 ENCOUNTER — Encounter (HOSPITAL_COMMUNITY): Payer: Self-pay | Admitting: Physician Assistant

## 2015-09-07 LAB — BASIC METABOLIC PANEL
ANION GAP: 6 (ref 5–15)
BUN: 8 mg/dL (ref 6–20)
CALCIUM: 9 mg/dL (ref 8.9–10.3)
CO2: 26 mmol/L (ref 22–32)
Chloride: 102 mmol/L (ref 101–111)
Creatinine, Ser: 1.02 mg/dL (ref 0.61–1.24)
GLUCOSE: 94 mg/dL (ref 65–99)
POTASSIUM: 3.9 mmol/L (ref 3.5–5.1)
SODIUM: 134 mmol/L — AB (ref 135–145)

## 2015-09-07 LAB — MAGNESIUM: MAGNESIUM: 2.2 mg/dL (ref 1.7–2.4)

## 2015-09-07 LAB — PHOSPHORUS: Phosphorus: 3.9 mg/dL (ref 2.5–4.6)

## 2015-09-07 MED ORDER — ATORVASTATIN CALCIUM 80 MG PO TABS: 80.0000 mg | ORAL_TABLET | Freq: Every day | ORAL | Status: AC

## 2015-09-07 MED ORDER — METOPROLOL TARTRATE 25 MG PO TABS: 12.5000 mg | ORAL_TABLET | Freq: Two times a day (BID) | ORAL | Status: DC

## 2015-09-07 MED ORDER — NITROGLYCERIN 0.4 MG SL SUBL
0.4000 mg | SUBLINGUAL_TABLET | SUBLINGUAL | Status: AC | PRN
Start: 2015-09-07 — End: ?

## 2015-09-07 MED ORDER — LISINOPRIL 5 MG PO TABS: 5.0000 mg | ORAL_TABLET | Freq: Every day | ORAL | Status: DC

## 2015-09-07 MED ORDER — LISINOPRIL 5 MG PO TABS: 5.0000 mg | ORAL_TABLET | Freq: Every day | ORAL | Status: AC

## 2015-09-07 MED ORDER — ASPIRIN 81 MG PO CHEW: 81.0000 mg | CHEWABLE_TABLET | Freq: Every day | ORAL | Status: DC

## 2015-09-07 MED ORDER — TICAGRELOR 90 MG PO TABS: 90.0000 mg | ORAL_TABLET | Freq: Two times a day (BID) | ORAL | Status: DC

## 2015-09-07 MED ORDER — ATORVASTATIN CALCIUM 80 MG PO TABS: 80.0000 mg | ORAL_TABLET | Freq: Every day | ORAL | Status: DC

## 2015-09-07 NOTE — Progress Notes (Signed)
Pt discharged home with family.  Reviewed discharge instructions and education, all questions answered.  Assessment unchanged from earlier.  

## 2015-09-07 NOTE — Discharge Summary (Signed)
Discharge Summary   Patient ID: Chad Copeland,  MRN: [id number], DOB/AGE: 45-Mar-1972 45 y.o.  Admit date: 09/05/2015 Discharge date: 09/07/2015  Primary Care Provider: Currently does not have PCP Primary Cardiologist: Dr. Excell Seltzer   Discharge Diagnoses Principal Problem:   NSTEMI (non-ST elevated myocardial infarction) [facility]) Active Problems:   Tobacco abuse   Allergies No Known Allergies  Procedures  Cardiac catheterization 09/05/2015 Conclusion     1st Mrg lesion, 100% stenosed. Post intervention, there is a 0% residual stenosis. The vessel was treated with a 3.5 x 16 Synergy drug-eluting stent, postdilated to greater than 4 mm in diameter.  There is mild to moderate left ventricular systolic dysfunction.  There is No left main. There are separate ostia of the LAD and circumflex.  Continue dual antiplatelet therapy along with aggressive secondary prevention. He'll be watched in the ICU. Continue tirofiban for 4 hours. Hopefully, this will help with resolution of the residual thrombus in the small side branch.  Would recommend getting an echocardiogram at a later time to better assess lateral wall motion.      Echocardiogram 09/06/2015 LV EF: 40% -  45%  ------------------------------------------------------------------- Indications:   CAD of native vessels 414.01.  ------------------------------------------------------------------- History:  Risk factors: Current tobacco use.  ------------------------------------------------------------------- Study Conclusions  - Left ventricle: Inferior and septal hypokinesis. The cavity size was mildly dilated. There was mild focal basal hypertrophy of the septum. Systolic function was mildly to moderately reduced. The estimated ejection fraction was in the range of 40% to 45%. - Atrial septum: A patent foramen ovale cannot be excluded.    Hospital Course  The patient is a 44 year old male with past medical  history significant for depression, tobacco abuse who presented to [facility] on [facility]05/2015 complaint of chest pain. He has no prior cardiac disease. He woke up around 4 AM on the day of arrival with substernal chest pressure and tingling in his arm. Initial EKG is suggestive for ischemia, however was not diagnostic for STEMI. Given evidence of cardiac injury, various options been discussed with the patient, he agreed to undergo emergent cardiac catheterization and possible PCI. He was counseled on tobacco cessation. Cardiac catheterization performed on 09/05/2015 showed 100% OM1 stenosis treated with 3.5 x 16 mm Synergy DES postdilated to greater than 4 mm in diameter, mild to moderate LV dysfunction. It appears he does not have left main, there are separate ostia for LAD and left circumflex. Postprocedure, he was placed on aspirin and Brilinta. Aggressive secondary prevention was recommended. Tirofiban was continued for 4 hours post procedure in the hope to help resolve the residual thrombus in the small side branch.  Post procedure, his troponin peaked at greater than 65. Lipid panel was obtained on 11/23 which showed cholesterol 150, HDL 46, LDL 90, triglycerides 71. Hemoglobin A1c was mildly elevated at 5.7, I have recommended diet and exercise. echocardiogram was obtained on 09/06/2015 which showed EF 40-45%, inferior and septal hypokinesis, mild focal basal hypertrophy of the septum noted, a patent foramen ovale cannot be excluded. Emphasis has been placed on tobacco cessation and compliance with DAPT.  I will arrange one week outpatient follow-up. He currently does not have a PCP, I have recommended him to set up a PCP appointment to further monitor his hemoglobin A1C. A repeat limited echo can be obtained in 6 weeks to monitor LV function. He is going to live with his parents for the next few days, I have given him 30 day Rx and 1 yr Rx for  each medication. He is in [facility] research study and  will get free ASA and Brilinta through the study, therefore I did not prescribe Brilinta. I have instructed him to contact research trial if he ever run the risk of running out DAPT.    Discharge Vitals Blood pressure 100/67, pulse 81, temperature 98.9 F (37.2 C), temperature source Oral, resp. rate 18, height 5\' 10"  (1.778 m), weight 219 lb 1.6 oz (99.383 kg), SpO2 97 %.  Filed Weights   09/05/15 0444 09/05/15 1000 09/07/15 0428  Weight: 225 lb (102.059 kg) 223 lb 1.7 oz (101.2 kg) 219 lb 1.6 oz (99.383 kg)    Labs  CBC  Recent Labs  09/05/15 0527 09/05/15 1101 09/05/15 1420 09/06/15 0310  WBC 7.5 8.5  --  8.1  NEUTROABS 2.4  --   --   --   HGB 16.4 15.3  --  15.9  HCT 46.6 44.2  --  45.4  MCV 97.5 97.8  --  96.4  PLT 196 174 172 175   Basic Metabolic Panel  Recent Labs  09/06/15 0310 09/07/15 0347  NA 136 134*  K 3.7 3.9  CL 103 102  CO2 24 26  GLUCOSE 102* 94  BUN 5* 8  CREATININE 0.96 1.02  CALCIUM 9.2 9.0  MG  --  2.2  PHOS  --  3.9   Cardiac Enzymes  Recent Labs  09/05/15 1420 09/05/15 1822 09/06/15 1205  TROPONINI >65.00* >65.00* 57.07*   BNP Invalid input(s): POCBNP D-Dimer No results for input(s): DDIMER in the last 72 hours. Hemoglobin A1C  Recent Labs  09/05/15 1100  HGBA1C 5.7*   Fasting Lipid Panel  Recent Labs  09/06/15 0310  CHOL 150  HDL 46  LDLCALC 90  TRIG 71  CHOLHDL 3.3    Disposition  Pt is being discharged home today in good condition.  Follow-up Plans & Appointments      Follow-up Information    Follow up with Tonny Bollmanooper, Manvir, MD.   Specialty:  Cardiology   Why:  Office will contact you to arrange outpatient followup, please give us a call if you do not hear from us in 2 business days   Contact information:   [address] [place] [address] [phone]       Discharge Medications    Medication List    TAKE these medications        aspirin 81 MG chewable tablet  Chew 1  tablet (81 mg total) by mouth daily.     atorvastatin 80 MG tablet  Commonly known as:  LIPITOR  Take 1 tablet (80 mg total) by mouth daily at 6 PM.     lisinopril 5 MG tablet  Commonly known as:  PRINIVIL,ZESTRIL  Take 1 tablet (5 mg total) by mouth daily.     metoprolol tartrate 25 MG tablet  Commonly known as:  LOPRESSOR  Take 0.5 tablets (12.5 mg total) by mouth 2 (two) times daily.     nitroGLYCERIN 0.4 MG SL tablet  Commonly known as:  NITROSTAT  Place 1 tablet (0.4 mg total) under the tongue every 5 (five) minutes x 3 doses as needed for chest pain.     ticagrelor 90 MG Tabs tablet  Commonly known as:  BRILINTA  Take 1 tablet (90 mg total) by mouth 2 (two) times daily.         Duration of Discharge Encounter   Greater than 30 minutes including physician time.  Signed, Lisabeth DevoidMeng,  Laurell Josephs Pager: [phone] 09/07/2015, 11:09 AM

## 2015-09-07 NOTE — Discharge Instructions (Signed)
Acute Coronary Syndrome °Acute coronary syndrome (ACS) is a serious problem in which there is suddenly not enough blood and oxygen supplied to the heart. ACS may mean that one or more of the blood vessels in your heart (coronary arteries) may be blocked. ACS can result in chest pain or a heart attack (myocardial infarction or MI). °CAUSES °This condition is caused by atherosclerosis, which is the buildup of fat and cholesterol (plaque) on the inside of the arteries. Over time, the plaque may narrow or block the artery, and this will lessen blood flow to the heart. Plaque can also become weak and break off within a coronary artery to form a clot and cause a sudden blockage. °RISK FACTORS °The risks factors of this condition include: °· High cholesterol levels. °· High blood pressure (hypertension). °· Smoking. °· Diabetes. °· Age. °· Family history of chest pain, heart disease, or stroke. °· Lack of exercise. °SYMPTOMS °The most common signs of this condition include: °· Chest pain, which can be: °¨ A crushing or squeezing in the chest. °¨ A tightness, pressure, fullness, or heaviness in the chest. °¨ Present for more than a few minutes, or it can stop and recur. °· Pain in the arms, neck, jaw, or back. °· Unexplained heartburn or indigestion. °· Shortness of breath. °· Nausea. °· Sudden cold sweats. °· Feeling light-headed or dizzy. °Sometimes, this condition has no symptoms. °DIAGNOSIS °ACS may be diagnosed through the following tests: °· Electrocardiogram (ECG). °· Blood tests. °· Coronary angiogram. This is a procedure to look at the coronary arteries to see if there is any blockage. °TREATMENT °Treatment for ACS may include: °· Healthy behavioral changes to reduce or control risk factors. °· Medicine. °· Coronary stenting. A stent helps to keep an artery open. °· Coronary angioplasty. This procedure widens a narrowed or blocked artery. °· Coronary artery bypass surgery. This will allow your blood to pass the  blockage (bypass) to reach your heart. °HOME CARE INSTRUCTIONS °Eating and Drinking °· Follow a heart-healthy diet. A dietitian can you help to educate you about healthy food options and changes. °· Use healthy cooking methods such as roasting, grilling, broiling, baking, poaching, steaming, or stir-frying. Talk to a dietitian to learn more about healthy cooking methods. °Medicines °· Take medicines only as directed by your health care provider. °· Do not take the following medicines unless your health care provider approves: °¨ Nonsteroidal anti-inflammatory drugs (NSAIDs), such as ibuprofen, naproxen, or celecoxib. °¨ Vitamin supplements that contain vitamin A, vitamin E, or both. °¨ Hormone replacement therapy that contains estrogen with or without progestin. °· Stop illegal drug use. °Activities °· Follow an exercise program that is approved by your health care provider. °· Plan rest periods when you are fatigued. °Lifestyle °· Do not use any tobacco products, including cigarettes, chewing tobacco, or electronic cigarettes. If you need help quitting, ask your health care provider. °· If you drink alcohol, and your health care provider approves, limit your alcohol intake to no more than 1 drink per day. One drink equals 12 ounces of beer, 5 ounces of wine, or 1½ ounces of hard liquor. °· Learn to manage stress. °· Maintain a healthy weight. Lose weight as approved by your health care provider. °General Instructions °· Manage other health conditions, such as hypertension and diabetes, as directed by your health care provider. °· Keep all follow-up visits as directed by your health care provider. This is important. °· Your health care provider may ask you to monitor your blood   pressure. A blood pressure reading consists of a higher number over a lower number, such as 110 over 72, written as 110/72. Ideally, your blood pressure should be:  Below 140/90 if you have no other medical conditions.  Below 130/80 if  you have diabetes or kidney disease. SEEK IMMEDIATE MEDICAL CARE IF:  You have pain in your chest, neck, arm, jaw, stomach, or back that lasts more than a few minutes, is recurring, or is not relieved by taking medicine under your tongue (sublingual nitroglycerin).  You have profuse sweating without cause.  You have unexplained:  Heartburn or indigestion.  Shortness of breath or difficulty breathing.  Nausea or vomiting.  Fatigue.  Feelings of nervousness or anxiety.  Weakness.  Diarrhea.  You have sudden light-headedness or dizziness.  You faint. These symptoms may represent a serious problem that is an emergency. Do not wait to see if the symptoms will go away. Get medical help right away. Call your local emergency services (911 in the [place] Do not drive yourself to the clinic or hospital.   This information is not intended to replace advice given to you by your health care provider. Make sure you discuss any questions you have with your health care provider.   Document Released: 09/30/2005 Document Revised: 10/21/2014 Document Reviewed: 02/01/2014 Elsevier Interactive Patient Education [employer]2016 [employer].  No driving for 24 hours. No lifting over 5 lbs for 1 week. No sexual activity for 1 week. You may return to work on 11/28. Keep procedure site clean & dry. If you notice increased pain, swelling, bleeding or pus, call/return!  You may shower, but no soaking baths/hot tubs/pools for 1 week.

## 2015-09-07 NOTE — Progress Notes (Signed)
    Subjective:  Feels well. No CP or dyspnea. Did well with cardiac rehab yesterday.  Objective:  Vital Signs in the last 24 hours: Temp:  [97.8 F (36.6 C)-99.1 F (37.3 C)] 98.9 F (37.2 C) (11/24 0428) Pulse Rate:  [81-98] 81 (11/24 0428) Resp:  [16-18] 18 (11/23 1316) BP: (99-112)/(60-87) 100/67 mmHg (11/24 0428) SpO2:  [95 %-99 %] 97 % (11/24 0428) Weight:  [219 lb 1.6 oz (99.383 kg)] 219 lb 1.6 oz (99.383 kg) (11/24 0428)  Intake/Output from previous day: 11/23 0701 - 11/24 0700 In: 800 [P.O.:800] Out: 500 [Urine:500]  Physical Exam: Pt is alert and oriented, NAD HEENT: normal Neck: JVP - normal Lungs: CTA bilaterally CV: RRR without murmur or gallop Abd: soft, NT, Positive BS, no hepatomegaly Ext: no C/C/E, distal pulses intact and equal Skin: warm/dry no rash   Lab Results:  Recent Labs  09/05/15 1101 09/05/15 1420 09/06/15 0310  WBC 8.5  --  8.1  HGB 15.3  --  15.9  PLT 174 172 175    Recent Labs  09/06/15 0310 09/07/15 0347  NA 136 134*  K 3.7 3.9  CL 103 102  CO2 24 26  GLUCOSE 102* 94  BUN 5* 8  CREATININE 0.96 1.02    Recent Labs  09/05/15 1822 09/06/15 1205  TROPONINI >65.00* 57.07*    Cardiac Studies: 2D Echo: Study Conclusions  - Left ventricle: Inferior and septal hypokinesis. The cavity size was mildly dilated. There was mild focal basal hypertrophy of the septum. Systolic function was mildly to moderately reduced. The estimated ejection fraction was in the range of 40% to 45%. - Atrial septum: A patent foramen ovale cannot be excluded.  Tele: Personally reviewed. NSR without arrhythmia.   Assessment/Plan:  1. NSTEMI: OM occlusion now s/p PCI with a DES platform 2. Ischemic cardiomyopathy: LVEF 40-45% by echo 3. Hyperlipidemia: LDL 90 mg/dL 4. Tobacco abuse: cessation counseling done.  Pt doing well with uncomplicated hospital course. Med program reviewed and includes DAPT with ASA And brilinta, lisinopril,  and metoprolol in setting of LV dysfunction. Add high-intensity statin lipitor 80 mg (was on initially but fell of med list).  Anticipate discharge home today. Off work until 12/5.   Tonny Bollmanooper, Faiz, M.D. 09/07/2015, 8:03 AM

## 2015-09-08 ENCOUNTER — Other Ambulatory Visit: Payer: Self-pay | Admitting: *Deleted

## 2015-09-08 MED ORDER — AMBULATORY NON FORMULARY MEDICATION: 90.0000 mg | Freq: Two times a day (BID) | Status: DC

## 2015-09-08 MED ORDER — AMBULATORY NON FORMULARY MEDICATION: 81.0000 mg | Freq: Every day | Status: DC

## 2015-09-11 ENCOUNTER — Telehealth: Payer: Self-pay | Admitting: Cardiovascular Disease

## 2015-09-11 NOTE — Telephone Encounter (Signed)
Called patient about TOC.  Patient reported that he is doing well, no heart symptoms.  Is aware of his followup appt with Chad Copeland on 12/1.  Not having any issues with meds.

## 2015-09-11 NOTE — Telephone Encounter (Signed)
1 wk TOC w/ Rhonda 12/1 @ 10am Per Wynema BirchHao

## 2015-09-14 ENCOUNTER — Encounter: Payer: Self-pay | Admitting: Physician Assistant

## 2015-09-14 ENCOUNTER — Ambulatory Visit (INDEPENDENT_AMBULATORY_CARE_PROVIDER_SITE_OTHER): Payer: Self-pay | Admitting: Physician Assistant

## 2015-09-14 ENCOUNTER — Telehealth: Payer: Self-pay | Admitting: Physician Assistant

## 2015-09-14 VITALS — BP 100/62 | HR 61 | Ht 70.5 in | Wt 219.6 lb

## 2015-09-14 DIAGNOSIS — E785 Hyperlipidemia, unspecified: Secondary | ICD-10-CM

## 2015-09-14 DIAGNOSIS — I255 Ischemic cardiomyopathy: Secondary | ICD-10-CM

## 2015-09-14 MED ORDER — CARVEDILOL 3.125 MG PO TABS: 3.1250 mg | ORAL_TABLET | Freq: Two times a day (BID) | ORAL | Status: AC

## 2015-09-14 NOTE — Progress Notes (Signed)
Cardiology Office Note   Date:  09/14/2015   ID:  Chad Copeland, DOB 1971-10-09, MRN [id number]  PCP:  No PCP Per Patient  Cardiologist:  Dr Vale Havenooper  Ashleigh Arya, PA-C   Chief Complaint  Patient presents with  . transition of care    nstemi    History of Present Illness: Chad NaasMichael Copeland is a 44 y.o. male with a history of d/c 11/24 after NSTEMI w/ DES OM1, Twilight research study, EF 40-45%, depression, tob abuse.  Chad Copeland presents for post hospital follow-up.  [name] is determined to watch after his health since all of this happened. [place]. He had an outing, going to the grocery store, but that made him tired and he needed to rest afterward. However, he had no chest pain or shortness of breath.  He was tolerating the medications well, but yesterday he woke up with a rash which has since spread and gotten worse. It is itching. There is no drainage. He has no difficulty swallowing. He has noticed no swelling around his lips and no swelling in his tongue or mouth. He has no shortness of breath or wheezing. He has not been lightheaded. He has a couple small areas of bruising, but they are not extensive.  He has strong cravings for cigarettes, but has not smoked since discharge. He is determined to quit cold [place].   Past Medical History  Diagnosis Date  . Depression   . Anxiety   . Tobacco abuse 09/05/2015  . CAD (coronary artery disease)     cath 09/05/2015 100% OM1 stenosis treated with 3.5 x 16 mm Synergy DES    Past Surgical History  Procedure Laterality Date  . Cardiac catheterization N/A 09/05/2015    Procedure: Left Heart Cath and Coronary Angiography;  Surgeon: Corky CraftsJayadeep S Varanasi, MD;  [facility];  Service: Cardiovascular;  Laterality: N/A;  . Cardiac catheterization  09/05/2015    Procedure: Coronary Stent Intervention;  Surgeon: Corky CraftsJayadeep S Varanasi, MD;  [facility];   Service: Cardiovascular;;    Current Outpatient Prescriptions  Medication Sig Dispense Refill  . AMBULATORY NON FORMULARY MEDICATION Take 90 mg by mouth 2 (two) times daily. Medication Name: BRILINTA 90 mg BID (TWILIGHT Research Study provided)    . AMBULATORY NON FORMULARY MEDICATION Take 81 mg by mouth daily. Medication Name: ASA 81 mg Daily (TWILIGHT Research Study provided)    . atorvastatin (LIPITOR) 80 MG tablet Take 1 tablet (80 mg total) by mouth daily at 6 PM. 30 tablet 11  . lisinopril (PRINIVIL,ZESTRIL) 5 MG tablet Take 1 tablet (5 mg total) by mouth daily. 30 tablet 11  . metoprolol tartrate (LOPRESSOR) 25 MG tablet Take 0.5 tablets (12.5 mg total) by mouth 2 (two) times daily. 30 tablet 11  . nitroGLYCERIN (NITROSTAT) 0.4 MG SL tablet Place 1 tablet (0.4 mg total) under the tongue every 5 (five) minutes x 3 doses as needed for chest pain. 25 tablet 3   No current facility-administered medications for this visit.    Allergies:   Review of patient's allergies indicates no known allergies.    Social History:  The patient  reports that he quit smoking 7 days ago. His smoking use included Cigarettes. He smoked 1.00 pack per day. He does not have any smokeless tobacco history on file. He reports that he drinks about 3.6 oz of alcohol per week. He reports that he does not use illicit  drugs.   Family History:  The patient's family history includes CAD in his maternal grandfather; Coronary artery disease (age of onset: 43) in his paternal grandfather; Depression in his mother; Diabetes type II in his father; Heart attack in his paternal uncle; Hyperlipidemia in his father; Hypertension in his father.    ROS:  Please see the history of present illness. All other systems are reviewed and negative.    PHYSICAL EXAM: VS:  BP 100/62 mmHg  Pulse 61  Ht 5' 10.5" (1.791 m)  Wt 219 lb 9.6 oz (99.61 kg)  BMI 31.05 kg/m2  SpO2 99% , BMI Body mass index is 31.05 kg/(m^2). GEN: Well  nourished, well developed, male in no acute distress HEENT: normal for age; no swelling is noted in the lips or oropharynx Neck: no JVD, no carotid bruit, no masses Cardiac: RRR; no murmur, no rubs, or gallops Respiratory:  clear to auscultation bilaterally, normal work of breathing GI: soft, nontender, nondistended, + BS MS: no deformity or atrophy; no edema; distal pulses are 2+ in all 4 extremities  Skin: warm and dry, extensive rash on all 4 extremities and the trunk. There is some on his neck but none is noted on his face or head. Neuro:  Strength and sensation are intact Psych: euthymic mood, full affect   EKG:  EKG is ordered today. The ekg ordered today demonstrates sinus rhythm, rate 61, abnormal lateral T waves consistent with a recent MI   Recent Labs: 09/05/2015: B Natriuretic Peptide 15.8 09/06/2015: Hemoglobin 15.9; Platelets 175 09/07/2015: BUN 8; Creatinine, Ser 1.02; Magnesium 2.2; Potassium 3.9; Sodium 134*    Lipid Panel    Component Value Date/Time   CHOL 150 09/06/2015 0310   TRIG 71 09/06/2015 0310   HDL 46 09/06/2015 0310   CHOLHDL 3.3 09/06/2015 0310   VLDL 14 09/06/2015 0310   LDLCALC 90 09/06/2015 0310     Wt Readings from Last 3 Encounters:  09/14/15 219 lb 9.6 oz (99.61 kg)  09/07/15 219 lb 1.6 oz (99.383 kg)     Other studies Reviewed: Additional studies/ records that were reviewed today include: Hospital records and ECG.  ASSESSMENT AND PLAN:  1.  Non-STEMI: I discussed his medication regimen and while the medications are appropriate for him at this time. His blood pressure is low normal but he feels well so we will make no medication changes based on that at this time. He is encouraged to continue the twilight study medications including Brilinta, statin, ACE inhibitor and beta blocker. He is cleared to return to work on Monday, no lifting over 15 pounds for 3 weeks. He understands the need to rest when he feels tired and not push himself too  hard when he first goes back. He does not believe this will be a problem.  2. Rash: This is almost certainly a drug reaction. He has no other changes in his life. He had no symptoms until yesterday but since then the rash has spread over his whole body and is itching.  Discussed this with Waynard Reeds, the pharmacist. The most likely culprit for the rash is metoprolol. We will therefore change his metoprolol to carvedilol at 3.125 mg twice a day. He is clear to use Benadryl cream on the rash and take Benadryl at night. Because the rash is so extensive, I am reluctant to use steroid cream and because of his recent MI, I do not wish to use oral steroids. The patient states the itching is not that bad  and he is agreeable to changing his medications one at a time to try and figure out which one is the culprit. His him I would do the Brilinta last and he is agreeable with this.  Current medicines are reviewed at length with the patient today.  The patient does not have concerns regarding medicines.  The following changes have been made:  Change metoprolol to carvedilol. If the rash does not resolve, we will next hold the lisinopril to see if this is the culprit and then if that does not work, we will hold the Lipitor.  Labs/ tests ordered today include:  No orders of the defined types were placed in this encounter.     Disposition:   FU with Dr. Excell Seltzer in 3 months, he will need an echocardiogram plus fasting lipids and liver function testing prior to this.  Tawny Asal  09/14/2015 10:03 AM    [facility] [address] [place], [place], [place]  [address] Phone: [phone]; Fax: [phone]

## 2015-09-14 NOTE — Telephone Encounter (Signed)
Pt sts that he was able to access his work note through my chart, he has already printed and faxed it to his employer. Pt aware of fasting lab appt scheduled the same day as his echo on 12/25/15

## 2015-09-14 NOTE — Addendum Note (Signed)
Addended by: Jarvis NewcomerPARRIS-GODLEY, LISA S on: 09/14/2015 12:25 PM   Modules accepted: Orders

## 2015-09-14 NOTE — Telephone Encounter (Signed)
Returned pt call. Unable to lmom. Pt voicemail has not been set up

## 2015-09-14 NOTE — Patient Instructions (Signed)
Medication Instructions:  Your physician has recommended you make the following change in your medication:  1) STOP Metoprolol 2) START Carvedilol 3.125mg  Twice daily. An Rx has been sent to your pharmacy   Labwork: None ordered  Testing/Procedures: Your physician has requested that you have an echocardiogram. Echocardiography is a painless test that uses sound waves to create images of your heart. It provides your doctor with information about the size and shape of your heart and how well your heart's chambers and valves are working. This procedure takes approximately one hour. There are no restrictions for this procedure. (To be schedule in 3 months)  Follow-Up: Your physician recommends that you schedule a follow-up appointment in: 3 months with Dr.Cooper  Any Other Special Instructions Will Be Listed Below (If Applicable). Ok to use topical and oral Benadryl for itching    If you need a refill on your cardiac medications before your next appointment, please call your pharmacy.

## 2015-09-14 NOTE — Telephone Encounter (Signed)
NewMessage  Pt stated that during OV w/ Rhonda today 12/1, pt was to receive "return to work" papers. Pt stated that when he got home he realized that paperwork was not included. Please call back and discuss.

## 2015-10-05 ENCOUNTER — Telehealth: Payer: Self-pay | Admitting: *Deleted

## 2015-10-05 NOTE — Telephone Encounter (Signed)
called for RX but he has printed scripts, he will have them filled at the pharmacy.

## 2015-10-12 ENCOUNTER — Encounter: Payer: Self-pay | Admitting: *Deleted

## 2015-10-12 DIAGNOSIS — Z006 Encounter for examination for normal comparison and control in clinical research program: Secondary | ICD-10-CM

## 2015-10-13 NOTE — Progress Notes (Signed)
TWILIGHT research 1 month telephone follow up completed. Questions encouraged and answered. Patient denies any adverse or bleeding events.

## 2015-11-26 ENCOUNTER — Other Ambulatory Visit: Payer: Self-pay | Admitting: Physician Assistant

## 2015-11-26 ENCOUNTER — Encounter (HOSPITAL_COMMUNITY): Payer: Self-pay | Admitting: Emergency Medicine

## 2015-11-26 ENCOUNTER — Observation Stay (HOSPITAL_COMMUNITY)
Admission: EM | Admit: 2015-11-26 | Discharge: 2015-11-26 | Disposition: A | Discharge status: Home / Self Care | Payer: BLUE CROSS/BLUE SHIELD | Attending: Cardiovascular Disease | Admitting: Cardiovascular Disease

## 2015-11-26 ENCOUNTER — Emergency Department (HOSPITAL_COMMUNITY): Payer: BLUE CROSS/BLUE SHIELD

## 2015-11-26 DIAGNOSIS — R079 Chest pain, unspecified: Secondary | ICD-10-CM

## 2015-11-26 DIAGNOSIS — F419 Anxiety disorder, unspecified: Secondary | ICD-10-CM | POA: Insufficient documentation

## 2015-11-26 DIAGNOSIS — Z79899 Other long term (current) drug therapy: Secondary | ICD-10-CM | POA: Insufficient documentation

## 2015-11-26 DIAGNOSIS — I2510 Atherosclerotic heart disease of native coronary artery without angina pectoris: Secondary | ICD-10-CM | POA: Diagnosis not present

## 2015-11-26 DIAGNOSIS — Z87891 Personal history of nicotine dependence: Secondary | ICD-10-CM | POA: Diagnosis not present

## 2015-11-26 DIAGNOSIS — F329 Major depressive disorder, single episode, unspecified: Secondary | ICD-10-CM | POA: Diagnosis not present

## 2015-11-26 DIAGNOSIS — R072 Precordial pain: Secondary | ICD-10-CM

## 2015-11-26 LAB — CBC
HCT: 37.2 % — ABNORMAL LOW (ref 39.0–52.0)
HEMOGLOBIN: 12.4 g/dL — AB (ref 13.0–17.0)
MCH: 31.6 pg (ref 26.0–34.0)
MCHC: 33.3 g/dL (ref 30.0–36.0)
MCV: 94.9 fL (ref 78.0–100.0)
PLATELETS: 242 10*3/uL (ref 150–400)
RBC: 3.92 MIL/uL — AB (ref 4.22–5.81)
RDW: 13.3 % (ref 11.5–15.5)
WBC: 6.8 10*3/uL (ref 4.0–10.5)

## 2015-11-26 LAB — BASIC METABOLIC PANEL
ANION GAP: 10 (ref 5–15)
BUN: 15 mg/dL (ref 6–20)
CALCIUM: 9.2 mg/dL (ref 8.9–10.3)
CO2: 24 mmol/L (ref 22–32)
CREATININE: 1.02 mg/dL (ref 0.61–1.24)
Chloride: 101 mmol/L (ref 101–111)
GFR calc non Af Amer: >60 mL/min (ref >60)
Glucose, Bld: 93 mg/dL (ref 65–99)
Potassium: 3.8 mmol/L (ref 3.5–5.1)
SODIUM: 135 mmol/L (ref 135–145)

## 2015-11-26 LAB — TROPONIN I
Troponin I: 0.04 ng/mL — ABNORMAL HIGH (ref <0.031)
Troponin I: 0.03 ng/mL (ref <0.031)

## 2015-11-26 LAB — LIPID PANEL
CHOL/HDL RATIO: 2.1 ratio
CHOLESTEROL: 103 mg/dL (ref 0–200)
HDL: 48 mg/dL (ref >40)
LDL Cholesterol: 44 mg/dL (ref 0–99)
TRIGLYCERIDES: 54 mg/dL (ref <150)
VLDL: 11 mg/dL (ref 0–40)

## 2015-11-26 LAB — I-STAT TROPONIN, ED: TROPONIN I, POC: 0 ng/mL (ref 0.00–0.08)

## 2015-11-26 LAB — D-DIMER, QUANTITATIVE (NOT AT ARMC)

## 2015-11-26 MED ORDER — LISINOPRIL 5 MG PO TABS
5.0000 mg | ORAL_TABLET | Freq: Every day | ORAL | Status: DC
  Administered 2015-11-26: 5 mg via ORAL
  Filled 2015-11-26: qty 1

## 2015-11-26 MED ORDER — ASPIRIN 81 MG PO CHEW
324.0000 mg | CHEWABLE_TABLET | Freq: Once | ORAL | Status: AC
  Administered 2015-11-26: 324 mg via ORAL
  Filled 2015-11-26: qty 4

## 2015-11-26 MED ORDER — ASPIRIN EC 81 MG PO TBEC
81.0000 mg | DELAYED_RELEASE_TABLET | Freq: Every day | ORAL | Status: DC
  Filled 2015-11-26: qty 1

## 2015-11-26 MED ORDER — NITROGLYCERIN 0.4 MG SL SUBL
0.4000 mg | SUBLINGUAL_TABLET | SUBLINGUAL | Status: DC | PRN
  Administered 2015-11-26 (×2): 0.4 mg via SUBLINGUAL
  Filled 2015-11-26: qty 1

## 2015-11-26 MED ORDER — NITROGLYCERIN 2 % TD OINT
1.0000 [in_us] | TOPICAL_OINTMENT | Freq: Once | TRANSDERMAL | Status: AC
  Administered 2015-11-26: 1 [in_us] via TOPICAL
  Filled 2015-11-26: qty 30

## 2015-11-26 MED ORDER — CARVEDILOL 3.125 MG PO TABS
3.1250 mg | ORAL_TABLET | Freq: Two times a day (BID) | ORAL | Status: DC
  Administered 2015-11-26: 3.125 mg via ORAL
  Filled 2015-11-26: qty 1

## 2015-11-26 MED ORDER — MORPHINE SULFATE (PF) 4 MG/ML IV SOLN
4.0000 mg | Freq: Once | INTRAVENOUS | Status: AC
  Administered 2015-11-26: 4 mg via INTRAVENOUS
  Filled 2015-11-26 (×2): qty 1

## 2015-11-26 MED ORDER — ONDANSETRON HCL 4 MG/2ML IJ SOLN
4.0000 mg | Freq: Once | INTRAMUSCULAR | Status: AC
Start: 2015-11-26 — End: 2015-11-26
  Administered 2015-11-26: 4 mg via INTRAVENOUS
  Filled 2015-11-26 (×2): qty 2

## 2015-11-26 MED ORDER — NITROGLYCERIN 0.4 MG SL SUBL
0.4000 mg | SUBLINGUAL_TABLET | SUBLINGUAL | Status: DC | PRN
Start: 2015-11-26 — End: 2015-11-26

## 2015-11-26 MED ORDER — ONDANSETRON HCL 4 MG/2ML IJ SOLN: 4.0000 mg | Freq: Four times a day (QID) | INTRAMUSCULAR | Status: DC | PRN

## 2015-11-26 MED ORDER — ACETAMINOPHEN 325 MG PO TABS: 650.0000 mg | ORAL_TABLET | ORAL | Status: DC | PRN

## 2015-11-26 MED ORDER — TICAGRELOR 90 MG PO TABS
90.0000 mg | ORAL_TABLET | Freq: Two times a day (BID) | ORAL | Status: DC
  Administered 2015-11-26: 90 mg via ORAL
  Filled 2015-11-26: qty 1

## 2015-11-26 MED ORDER — PANTOPRAZOLE SODIUM 40 MG PO TBEC
40.0000 mg | DELAYED_RELEASE_TABLET | Freq: Every day | ORAL | Status: DC
  Administered 2015-11-26: 40 mg via ORAL
  Filled 2015-11-26: qty 1

## 2015-11-26 MED ORDER — ATORVASTATIN CALCIUM 80 MG PO TABS: 80.0000 mg | ORAL_TABLET | Freq: Every day | ORAL | Status: DC

## 2015-11-26 MED ORDER — ENOXAPARIN SODIUM 40 MG/0.4ML ~~LOC~~ SOLN: 40.0000 mg | SUBCUTANEOUS | Status: DC

## 2015-11-26 NOTE — Discharge Summary (Signed)
Discharge Summary    Patient ID: Rilen Shukla,  MRN: [id number], DOB/AGE: Sep 18, 1971 45 y.o.  Admit date: 11/26/2015 Discharge date: 11/26/2015  Primary Care Provider: No PCP Per Patient Primary Cardiologist: Dr. Excell Seltzer  Discharge Diagnoses    Principal Problem:   Chest pain Active Problems:   CAD (coronary artery disease)   Allergies No Known Allergies  _____________   History of Present Illness     Mr. Gunby is a 45 year old man with history of coronary artery disease and PCI to OM1 on 11/04/14 with a Synergy 3.5 x 16 mm drug-eluting stent, ischemic cardiomyopathy with LVEF of 40-45%, and depression, who presents to the emergency department for further evaluation of chest pain. He reports intermittent chest pain over the last 3-4 days that did not have any clear precipitants and lasted for a minute or less. However, at approximately 10 PM last night while at work, the patient began having fluctuating chest pains that initially resolved for a few minutes after sublingual nitroglycerin. However, the pain returned more intensely at approximately 4 AM today and did not improve after taking 3 sublingual nitroglycerin tablets minutes apart. He noted accompanying tingling in his arms, shortness of breath, and dizziness, prompting him to seek medical attention.  Since arriving in the emergency department, the patient has continued to have intermittent chest pain. He will sometimes resolve completely on its own or following sublingual nitroglycerin and then return without warning a few minutes later. He notes that the pain is similar to but less intense than what he experienced at the time of his NTEMI in November. He reports being compliant with his medications, including dual antiplatelet therapy. There've been no recent medication changes. He endorses URI symptoms about 3-4 weeks ago, though these have since resolved on their own. He denies orthopnea, PND, edema, and  palpitations.  Hospital Course      Patient was admitted to cardiology service, although initial troponin was borderline elevated at 0.04, subsequent troponin was negative. Per patient, his chest discomfort feels very different from the previous angina, however he was worried about the long duration. He did not improve with nitroglycerin. We ambulated the patient in the hallway without significant worsening of chest discomfort. He is deemed stable for discharge from cardiology perspective to have outpatient nuclear stress test next week. I will also arrange a follow-up afterward.  _____________  Discharge Vitals Blood pressure 129/62, pulse 71, temperature 97.8 F (36.6 C), temperature source Oral, resp. rate 11, height 5' 10.5" (1.791 m), weight 234 lb 11.2 oz (106.459 kg), SpO2 99 %.  Filed Weights   11/26/15 0434 11/26/15 1159  Weight: 225 lb (102.059 kg) 234 lb 11.2 oz (106.459 kg)    Labs & Radiologic Studies     CBC  Recent Labs  11/26/15 0440  WBC 6.8  HGB 12.4*  HCT 37.2*  MCV 94.9  PLT 242   Basic Metabolic Panel  Recent Labs  11/26/15 0440  NA 135  K 3.8  CL 101  CO2 24  GLUCOSE 93  BUN 15  CREATININE 1.02  CALCIUM 9.2   Cardiac Enzymes  Recent Labs  11/26/15 0749 11/26/15 1221  TROPONINI 0.04* 0.03   D-Dimer  Recent Labs  11/26/15 0749  DDIMER <0.27   Fasting Lipid Panel  Recent Labs  11/26/15 0749  CHOL 103  HDL 48  LDLCALC 44  TRIG 54  CHOLHDL 2.1    Dg Chest 2 View  11/26/2015  CLINICAL DATA:  Acute  onset of chest pain radiating between the shoulder blades. Shortness of breath. Initial encounter. EXAM: CHEST  2 VIEW COMPARISON:  Chest radiograph performed 09/05/2015 FINDINGS: The lungs are well-aerated. Minimal left basilar atelectasis is noted. There is no evidence of pleural effusion or pneumothorax. The heart is normal in size; the mediastinal contour is within normal limits. No acute osseous abnormalities are seen. IMPRESSION:  Minimal left basilar atelectasis noted.  Lungs otherwise clear. Electronically Signed   By: Roanna Raider M.D.   On: 11/26/2015 04:52    Disposition   Pt is being discharged home today in good condition.  Follow-up Plans & Appointments    Follow-up Information    Follow up with [facility].   Specialty:  Cardiology   Why:  Office scheduler will contact you to arrange outpatient stress test. No food or drink or carvedilol in the morning of stress test.    Contact information:   [address] [place] [address] [phone]      Follow up with Tonny Bollman, MD On 12/29/2015.   Specialty:  Cardiology   Why:  11:15AM.    Contact information:   [address] [place] [address] [phone]      Discharge Instructions    Diet - low sodium heart healthy    Complete by:  As directed      Increase activity slowly    Complete by:  As directed            Discharge Medications   Current Discharge Medication List    CONTINUE these medications which have NOT CHANGED   Details  !! AMBULATORY NON FORMULARY MEDICATION Take 90 mg by mouth 2 (two) times daily. Medication Name: BRILINTA 90 mg BID (TWILIGHT Research Study provided)    !! AMBULATORY NON FORMULARY MEDICATION Take 81 mg by mouth daily. Medication Name: ASA 81 mg Daily (TWILIGHT Research Study provided)    atorvastatin (LIPITOR) 80 MG tablet Take 1 tablet (80 mg total) by mouth daily at 6 PM. Qty: 30 tablet, Refills: 11    carvedilol (COREG) 3.125 MG tablet Take 1 tablet (3.125 mg total) by mouth 2 (two) times daily. Qty: 60 tablet, Refills: 11   Associated Diagnoses: Cardiomyopathy, ischemic    lisinopril (PRINIVIL,ZESTRIL) 5 MG tablet Take 1 tablet (5 mg total) by mouth daily. Qty: 30 tablet, Refills: 11    nitroGLYCERIN (NITROSTAT) 0.4 MG SL tablet Place 1 tablet (0.4 mg total) under the tongue every 5 (five) minutes x 3 doses as needed for  chest pain. Qty: 25 tablet, Refills: 3     !! - Potential duplicate medications found. Please discuss with provider.       Aspirin prescribed at discharge?  Yes High Intensity Statin Prescribed? (Lipitor 40-80mg  or Crestor 20-40mg ): Yes Beta Blocker Prescribed? Yes For EF 45% or less, Was ACEI/ARB Prescribed? Yes ADP Receptor Inhibitor Prescribed? (i.e. Plavix etc.-Includes Medically Managed Patients): Yes    Outstanding Labs/Studies   Outpatient myoview  Duration of Discharge Encounter   Greater than 30 minutes including physician time.  Signed, Azalee Course PA-C 11/26/2015, 3:39 PM

## 2015-11-26 NOTE — ED Notes (Signed)
Cardiologist bedside

## 2015-11-26 NOTE — ED Notes (Signed)
Patient with MI history in November 2016.  Patient with chest pain that started around 2200 last night.  Patient denies any shortness of breath.  Patient does have some tingling in his arms, he did take nitro which did take his pain away for awhile.  Patient is CAOx4 at this time.

## 2015-11-26 NOTE — ED Notes (Signed)
Morphine and zofran held at this time per patient request.  States I don't think I need it just yet.  The pain comes and goes.

## 2015-11-26 NOTE — Progress Notes (Signed)
Patient Name: Chad Copeland Date of Encounter: 11/26/2015  Principal Problem:   Chest pain   Length of Stay:   SUBJECTIVE  He believes his chest discomfort was very different from previous angina, but was just worried about the long duration. It did not improve with NTG. One cardiac troponin level was nominally abnormal (0.04), the next was normal. Currently asymptomatic  CURRENT MEDS . [START ON 11/27/2015] aspirin EC  81 mg Oral Daily  . atorvastatin  80 mg Oral q1800  . carvedilol  3.125 mg Oral BID  . enoxaparin (LOVENOX) injection  40 mg Subcutaneous Q24H  . lisinopril  5 mg Oral Daily  . pantoprazole  40 mg Oral Daily  . ticagrelor  90 mg Oral BID    OBJECTIVE  No intake or output data in the 24 hours ending 11/26/15 1356 Filed Weights   11/26/15 0434 11/26/15 1159  Weight: 102.059 kg (225 lb) 106.459 kg (234 lb 11.2 oz)    PHYSICAL EXAM Filed Vitals:   11/26/15 1100 11/26/15 1115 11/26/15 1130 11/26/15 1159  BP: 115/73 110/71 101/72 129/62  Pulse: 70 70 63 71  Temp:    97.8 F (36.6 C)  TempSrc:    Oral  Resp: Height:    5' 10.5" (1.791 m)  Weight:    106.459 kg (234 lb 11.2 oz)  SpO2: 98% 96% 97% 99%   General: Alert, oriented x3, no distress Head: no evidence of trauma, PERRL, EOMI, no exophtalmos or lid lag, no myxedema, no xanthelasma; normal ears, nose and oropharynx Neck: normal jugular venous pulsations and no hepatojugular reflux; brisk carotid pulses without delay and no carotid bruits Chest: clear to auscultation, no signs of consolidation by percussion or palpation, normal fremitus, symmetrical and full respiratory excursions Cardiovascular: normal position and quality of the apical impulse, regular rhythm, normal first and second heart sounds, no rubs or gallops, no murmur Abdomen: no tenderness or distention, no masses by palpation, no abnormal pulsatility or arterial bruits, normal bowel sounds, no hepatosplenomegaly Extremities: no  clubbing, cyanosis or edema; 2+ radial, ulnar and brachial pulses bilaterally; 2+ right femoral, posterior tibial and dorsalis pedis pulses; 2+ left femoral, posterior tibial and dorsalis pedis pulses; no subclavian or femoral bruits Neurological: grossly nonfocal  LABS  CBC  Recent Labs  11/26/15 0440  WBC 6.8  HGB 12.4*  HCT 37.2*  MCV 94.9  PLT 242   Basic Metabolic Panel  Recent Labs  11/26/15 0440  NA 135  K 3.8  CL 101  CO2 24  GLUCOSE 93  BUN 15  CREATININE 1.02  CALCIUM 9.2   Liver Function Tests No results for input(s): AST, ALT, ALKPHOS, BILITOT, PROT, ALBUMIN in the last 72 hours. No results for input(s): LIPASE, AMYLASE in the last 72 hours. Cardiac Enzymes  Recent Labs  11/26/15 0749 11/26/15 1221  TROPONINI 0.04* 0.03   BNP Invalid input(s): POCBNP D-Dimer  Recent Labs  11/26/15 0749  DDIMER <0.27   Hemoglobin A1C No results for input(s): HGBA1C in the last 72 hours. Fasting Lipid Panel  Recent Labs  11/26/15 0749  CHOL 103  HDL 48  LDLCALC 44  TRIG 54  CHOLHDL 2.1   Thyroid Function Tests No results for input(s): TSH, T4TOTAL, T3FREE, THYROIDAB in the last 72 hours.  Invalid input(s): FREET3  Radiology Studies Imaging results have been reviewed and Dg Chest 2 View  11/26/2015  CLINICAL DATA:  Acute onset of chest pain radiating between the shoulder blades. Shortness  of breath. Initial encounter. EXAM: CHEST  2 VIEW COMPARISON:  Chest radiograph performed 09/05/2015 FINDINGS: The lungs are well-aerated. Minimal left basilar atelectasis is noted. There is no evidence of pleural effusion or pneumothorax. The heart is normal in size; the mediastinal contour is within normal limits. No acute osseous abnormalities are seen. IMPRESSION: Minimal left basilar atelectasis noted.  Lungs otherwise clear. Electronically Signed   By: Roanna Raider M.D.   On: 11/26/2015 04:52    TELE NSR  ECG Normal  ASSESSMENT AND PLAN Ambulate. If he  remains asymptomatic, DC home with outpatient nuclear study later this week.   Thurmon Fair, MD, [facility] [phone] office [phone] pager 11/26/2015 1:56 PM

## 2015-11-26 NOTE — H&P (Signed)
Cardiology Consultation Note    Patient ID: Chad Copeland MRN: [id number], DOB: [birth year] Date of Encounter: 11/26/2015, 7:20 AM Primary Physician: No PCP Per Patient Primary Cardiologist: Dr. Excell Seltzer  Chief Complaint: Chest pain Reason for Admission: Unstable angina Requesting MD: Dr. Elesa Massed  HPI: [name] is a 45 year old man with history of coronary artery disease and PCI to OM1 on 11/04/14 with a Synergy 3.5 x 16 mm drug-eluting stent, ischemic cardiomyopathy with LVEF of 40-45%, and depression, who presents to the emergency department for further evaluation of chest pain.  He reports intermittent chest pain over the last 3-4 days that did not have any clear precipitants and lasted for a minute or less.  However, at approximately 10 PM last night while at work, the patient began having fluctuating chest pains that initially resolved for a few minutes after sublingual nitroglycerin.  However, the pain returned more intensely at approximately 4 AM today and did not improve after taking 3 sublingual nitroglycerin tablets minutes apart.  He noted accompanying tingling in his arms, shortness of breath, and dizziness, prompting him to seek medical attention.  Since arriving in the emergency department, the patient has continued to have intermittent chest pain.  He will sometimes resolve completely on its own or following sublingual nitroglycerin and then return without warning a few minutes later.  He notes that the pain is similar to but less intense than what he experienced at the time of his NTEMI in November.  He reports being compliant with his medications, including dual antiplatelet therapy.  There've been no recent medication changes.  He endorses URI symptoms about 3-4 weeks ago, though these have since resolved on their own.  He denies orthopnea, PND, edema, and palpitations.  Past Medical History  Diagnosis Date  . Depression   . Anxiety   . Tobacco abuse 09/05/2015  . CAD (coronary  artery disease)     cath 09/05/2015 100% OM1 stenosis treated with 3.5 x 16 mm Synergy DES     Surgical History:  Past Surgical History  Procedure Laterality Date  . Cardiac catheterization N/A 09/05/2015    Procedure: Left Heart Cath and Coronary Angiography;  Surgeon: Corky Crafts, MD;  Location: [facility] INVASIVE CV LAB;  Service: Cardiovascular;  Laterality: N/A;  . Cardiac catheterization  09/05/2015    Procedure: Coronary Stent Intervention;  Surgeon: Corky Crafts, MD;  Location: [facility] INVASIVE CV LAB;  Service: Cardiovascular;;     Home Meds: Prior to Admission medications   Medication Sig Start Date Michelyn Scullin Date Taking? Authorizing Provider  AMBULATORY NON FORMULARY MEDICATION Take 90 mg by mouth 2 (two) times daily. Medication Name: BRILINTA 90 mg BID (TWILIGHT Research Study provided) 09/07/15  Yes Kathleene Hazel, MD  AMBULATORY NON FORMULARY MEDICATION Take 81 mg by mouth daily. Medication Name: ASA 81 mg Daily (TWILIGHT Research Study provided) 09/07/15  Yes Kathleene Hazel, MD  atorvastatin (LIPITOR) 80 MG tablet Take 1 tablet (80 mg total) by mouth daily at 6 PM. 09/07/15  Yes Azalee Course, PA  carvedilol (COREG) 3.125 MG tablet Take 1 tablet (3.125 mg total) by mouth 2 (two) times daily. 09/14/15  Yes Rhonda G Barrett, PA-C  lisinopril (PRINIVIL,ZESTRIL) 5 MG tablet Take 1 tablet (5 mg total) by mouth daily. 09/07/15  Yes Azalee Course, PA  nitroGLYCERIN (NITROSTAT) 0.4 MG SL tablet Place 1 tablet (0.4 mg total) under the tongue every 5 (five) minutes x 3 doses as needed for chest pain. 09/07/15  Yes  Azalee Course, [place]    Allergies: No Known Allergies  Social History   Social History  . Marital Status: Divorced    Spouse Name: N/A  . Number of Children: N/A  . Years of Education: N/A   Occupational History  . Estate manager/land agent    Social History Main Topics  . Smoking status: Former Smoker -- 1.00 packs/day    Types: Cigarettes    Quit date: 09/07/2015  . Smokeless  tobacco: Not on file  . Alcohol Use: 3.6 oz/week    6 Cans of beer per week  . Drug Use: No  . Sexual Activity: Not on file   Other Topics Concern  . Not on file   Social History Narrative   The patient works in [employer]. He is a one pack per day smoker for many years. He drinks alcohol on occasion. No illicit drug use.     Family History  Problem Relation Age of Onset  . Coronary artery disease Paternal Grandfather 68  . Depression Mother   . Diabetes type II Father   . Hyperlipidemia Father   . Hypertension Father   . Heart attack Paternal Uncle   . CAD Maternal Grandfather     Review of Systems: A 12 system review of systems was performed and was negative, except as noted in the history of present illness.  Labs:   Lab Results  Component Value Date   WBC 6.8 11/26/2015   HGB 12.4* 11/26/2015   HCT 37.2* 11/26/2015   MCV 94.9 11/26/2015   PLT 242 11/26/2015    Recent Labs Lab 11/26/15 0440  NA 135  K 3.8  CL 101  CO2 24  BUN 15  CREATININE 1.02  CALCIUM 9.2  GLUCOSE 93   No results for input(s): CKTOTAL, CKMB, TROPONINI in the last 72 hours. Lab Results  Component Value Date   CHOL 150 09/06/2015   HDL 46 09/06/2015   LDLCALC 90 09/06/2015   TRIG 71 09/06/2015   No results found for: DDIMER  Radiology/Studies:  Dg Chest 2 View  11/26/2015  CLINICAL DATA:  Acute onset of chest pain radiating between the shoulder blades. Shortness of breath. Initial encounter. EXAM: CHEST  2 VIEW COMPARISON:  Chest radiograph performed 09/05/2015 FINDINGS: The lungs are well-aerated. Minimal left basilar atelectasis is noted. There is no evidence of pleural effusion or pneumothorax. The heart is normal in size; the mediastinal contour is within normal limits. No acute osseous abnormalities are seen. IMPRESSION: Minimal left basilar atelectasis noted.  Lungs otherwise clear. Electronically Signed   By: Roanna Raider M.D.   On: 11/26/2015 04:52   Wt Readings  from Last 3 Encounters:  11/26/15 102.059 kg (225 lb)  09/14/15 99.61 kg (219 lb 9.6 oz)  09/07/15 99.383 kg (219 lb 1.6 oz)    EKG: Normal sinus rhythm without abnormalities.  Physical Exam: Blood pressure 114/72, pulse 76, temperature 98.8 F (37.1 C), temperature source Oral, resp. rate 18, height  (1.778 m), weight 102.059 kg (225 lb), SpO2 99 %. Body mass index is 32.28 kg/(m^2). General: Well developed, well nourished, in no acute distress. Head: Normocephalic, atraumatic, sclera non-icteric, no xanthomas, nares are without discharge.  Neck: Negative for carotid bruits. JVD not elevated. Lungs: Clear bilaterally to auscultation without wheezes, rales, or rhonchi. Breathing is unlabored. Heart: RRR with S1 S2. No murmurs, rubs, or gallops appreciated. Abdomen: Soft, non-tender, non-distended with normoactive bowel sounds. No hepatomegaly. No rebound/guarding. No obvious abdominal masses. Msk:  Strength and tone appear normal for age. Extremities: No clubbing or cyanosis. No edema.  Distal pedal pulses are 2+ and equal bilaterally. Neuro: Alert and oriented X 3. No focal deficit. No facial asymmetry. Moves all extremities spontaneously. Psych:  Responds to questions appropriately with a normal affect.    Assessment and Plan  Mr. Fikes is a 45 year old man with history of coronary artery disease and ischemic cardiomyopathy status post PCI to a large obtuse marginal branch and the setting of non-ST segment myocardial infarction in November, 2016, who presents with recurrent chest pain.  He has had atypical chest pain over the last 3-4 days which is now progressed to being present at rest and intermittently responsive to nitroglycerin, concerning for unstable angina.  Other considerations for his chest pain would include PE or aortic dissection, though the quality of his pain and his risk factors make these less likely.  GERD and/or esophageal spasm is also a possibility.  Admit  under observation status to telemetry  Continue dual antiplatelet therapy with aspirin and ticagrelor  We'll place nitroglycerin paste 1  Repeat EKG now and for additional episodes of chest pain  Continue atorvastatin and carvedilol, with repeat lipid panel to assess response to statin therapy  Trend troponins 3 or until they have peaked.  We will base decision on further ischemia evaluation on patient's troponins as well as chest pain responds to nitroglycerin paste  Will add PPI for potential component of GERD  CODE STATUS: Full code   Signed, Yvonne Kendall MD 11/26/2015, 7:20 AM

## 2015-11-26 NOTE — ED Provider Notes (Signed)
TIME SEEN: 5:50 AM  CHIEF COMPLAINT: Chest pain  HPI: Pt is a 45 y.o. male with history of tobacco use, coronary artery disease with drug-eluting stent placed to the obtuse marginal in November 2016 who presents to the emergency department with complaints of chest pain. Patient reports over the past week he has been having intermittent chest pain at rest that resolves on its own. He states tonight at 10 PM while at work he had right-sided sharp chest pain that moved into the left chest describes as a pressure. Took 3 nitroglycerin with complete resolution of symptoms. States pain came back around 4 AM and did not resolve with 3 nitroglycerin tablets. States this concerned him and he came to the emergency department. Reports he has had shortness of breath and dizziness. No nausea, vomiting or diaphoresis. Reports it is similar in quality to his previous anginal equivalent but not as severe. Reports he is on Brilinta has not missed any doses. Denies the pain is exertional or pleuritic. He does not aware of any aggravating factors. He is still having mild pain. No history of PE or DVT. No fevers or cough.   Cardiologist is Dr. Excell Seltzer  ROS: See HPI Constitutional: no fever  Eyes: no drainage  ENT: no runny nose   Cardiovascular:   chest pain  Resp: no SOB  GI: no vomiting GU: no dysuria Integumentary: no rash  Allergy: no hives  Musculoskeletal: no leg swelling  Neurological: no slurred speech ROS otherwise negative  PAST MEDICAL HISTORY/PAST SURGICAL HISTORY:  Past Medical History  Diagnosis Date  . Depression   . Anxiety   . Tobacco abuse 09/05/2015  . CAD (coronary artery disease)     cath 09/05/2015 100% OM1 stenosis treated with 3.5 x 16 mm Synergy DES    MEDICATIONS:  Prior to Admission medications   Medication Sig Start Date End Date Taking? Authorizing Provider  AMBULATORY NON FORMULARY MEDICATION Take 90 mg by mouth 2 (two) times daily. Medication Name: BRILINTA 90 mg BID  (TWILIGHT Research Study provided) 09/07/15  Yes Kathleene Hazel, MD  AMBULATORY NON FORMULARY MEDICATION Take 81 mg by mouth daily. Medication Name: ASA 81 mg Daily (TWILIGHT Research Study provided) 09/07/15  Yes Kathleene Hazel, MD  atorvastatin (LIPITOR) 80 MG tablet Take 1 tablet (80 mg total) by mouth daily at 6 PM. 09/07/15  Yes Azalee Course, PA  carvedilol (COREG) 3.125 MG tablet Take 1 tablet (3.125 mg total) by mouth 2 (two) times daily. 09/14/15  Yes Rhonda G Barrett, PA-C  lisinopril (PRINIVIL,ZESTRIL) 5 MG tablet Take 1 tablet (5 mg total) by mouth daily. 09/07/15  Yes Azalee Course, PA  nitroGLYCERIN (NITROSTAT) 0.4 MG SL tablet Place 1 tablet (0.4 mg total) under the tongue every 5 (five) minutes x 3 doses as needed for chest pain. 09/07/15  Yes Azalee Course, PA    ALLERGIES:  No Known Allergies  SOCIAL HISTORY:  Social History  Substance Use Topics  . Smoking status: Former Smoker -- 1.00 packs/day    Types: Cigarettes    Quit date: 09/07/2015  . Smokeless tobacco: Not on file  . Alcohol Use: 3.6 oz/week    6 Cans of beer per week    FAMILY HISTORY: Family History  Problem Relation Age of Onset  . Coronary artery disease Paternal Grandfather 44  . Depression Mother   . Diabetes type II Father   . Hyperlipidemia Father   . Hypertension Father   . Heart attack Paternal Uncle   .  CAD Maternal Grandfather     EXAM: BP 121/76 mmHg  Pulse 75  Temp(Src) 98.8 F (37.1 C) (Oral)  Resp 16  Ht  (1.778 m)  Wt 225 lb (102.059 kg)  BMI 32.28 kg/m2  SpO2 100% CONSTITUTIONAL: Alert and oriented and responds appropriately to questions. Well-appearing; well-nourished HEAD: Normocephalic EYES: Conjunctivae clear, PERRL ENT: normal nose; no rhinorrhea; moist mucous membranes; pharynx without lesions noted NECK: Supple, no meningismus, no LAD  CARD: RRR; S1 and S2 appreciated; no murmurs, no clicks, no rubs, no gallops RESP: Normal chest excursion without splinting  or tachypnea; breath sounds clear and equal bilaterally; no wheezes, no rhonchi, no rales, no hypoxia or respiratory distress, speaking full sentences ABD/GI: Normal bowel sounds; non-distended; soft, non-tender, no rebound, no guarding, no peritoneal signs BACK:  The back appears normal and is non-tender to palpation, there is no CVA tenderness EXT: Normal ROM in all joints; non-tender to palpation; no edema; normal capillary refill; no cyanosis, no calf tenderness or swelling    SKIN: Normal color for age and race; warm NEURO: Moves all extremities equally, sensation to light touch intact diffusely, cranial nerves II through XII intact PSYCH: The patient's mood and manner are appropriate. Grooming and personal hygiene are appropriate.  MEDICAL DECISION MAKING: Patient here with chest pain. He has a history of CAD with drug-eluting stent in November 2016. Reports some of his symptoms are similar to his prior anginal equivalent. We'll give aspirin, nitroglycerin. EKG shows new new ischemic changes. We'll obtain cardiac labs, chest x-ray. No risk factors for PE. Doubt dissection.  ED PROGRESS: Patient's labs unremarkable. First troponin is negative. Chest x-ray clear. Patient has had some improvement with nitroglycerin and is pain-free. Discussed with Dr. Okey Dupre with cardiology who will see the patient in consult.   Cardiology to admit.  They have placed orders.  Care transferred to cardiology service.       EKG Interpretation  Date/Time:  Sunday November 26 2015 04:31:18 EST Ventricular Rate:  75 PR Interval:  158 QRS Duration: 74 QT Interval:  386 QTC Calculation: 431 R Axis:   26 Text Interpretation:  Normal sinus rhythm Normal ECG Confirmed by Maciel Kegg,  DO, Tearah Saulsbury ([id number]) on 11/26/2015 5:54:04 AM        Layla Maw Solash Tullo, DO 11/26/15 4034

## 2015-11-28 ENCOUNTER — Telehealth (HOSPITAL_COMMUNITY): Payer: Self-pay | Admitting: *Deleted

## 2015-11-28 NOTE — Telephone Encounter (Signed)
Left message on voicemail per DPR in reference to upcoming appointment scheduled on 11/30/15 at 1000 with detailed instructions given per Myocardial Perfusion Study Information Sheet for the test. LM to arrive 15 minutes early, and that it is imperative to arrive on time for appointment to keep from having the test rescheduled. If you need to cancel or reschedule your appointment, please call the office within 24 hours of your appointment. Failure to do so may result in a cancellation of your appointment, and a $50 no show fee. Phone number given for call back for any questions. Seini Lannom, Adelene Idler

## 2015-11-30 ENCOUNTER — Ambulatory Visit (HOSPITAL_COMMUNITY): Payer: BLUE CROSS/BLUE SHIELD | Attending: Physician Assistant

## 2015-11-30 DIAGNOSIS — R079 Chest pain, unspecified: Secondary | ICD-10-CM | POA: Diagnosis not present

## 2015-11-30 DIAGNOSIS — R9439 Abnormal result of other cardiovascular function study: Secondary | ICD-10-CM | POA: Insufficient documentation

## 2015-11-30 LAB — MYOCARDIAL PERFUSION IMAGING
CHL CUP NUCLEAR SRS: 9
CHL CUP RESTING HR STRESS: 69 {beats}/min
CHL CUP STRESS STAGE 1 GRADE: 0 %
CHL CUP STRESS STAGE 1 HR: 71 {beats}/min
CHL CUP STRESS STAGE 2 HR: 71 {beats}/min
CHL CUP STRESS STAGE 2 SPEED: 0 mph
CHL CUP STRESS STAGE 3 DBP: 71 mmHg
CHL CUP STRESS STAGE 3 GRADE: 10 %
CHL CUP STRESS STAGE 3 SBP: 126 mmHg
CHL CUP STRESS STAGE 4 HR: 162 {beats}/min
CHL CUP STRESS STAGE 4 SBP: 126 mmHg
CHL CUP STRESS STAGE 5 GRADE: 12 %
CHL CUP STRESS STAGE 5 SPEED: 2.5 mph
CHL CUP STRESS STAGE 6 DBP: 69 mmHg
CHL CUP STRESS STAGE 7 GRADE: 0 %
CHL CUP STRESS STAGE 7 SPEED: 0 mph
CSEPED: 6 min
CSEPEDS: 0 s
CSEPEW: 7 METS
CSEPPHR: 162 {beats}/min
LHR: 0.46
LV sys vol: 54 mL
LVDIAVOL: 113 mL
MPHR: 175 {beats}/min
Percent HR: 92 %
Percent of predicted max HR: 92 %
RPE: 18
SDS: 1
SSS: 10
Stage 1 DBP: 61 mmHg
Stage 1 SBP: 99 mmHg
Stage 1 Speed: 0 mph
Stage 2 Grade: 0 %
Stage 3 HR: 127 {beats}/min
Stage 3 Speed: 1.7 mph
Stage 4 DBP: 72 mmHg
Stage 4 Grade: 12 %
Stage 4 Speed: 2.5 mph
Stage 5 HR: 162 {beats}/min
Stage 6 Grade: 0 %
Stage 6 HR: 141 {beats}/min
Stage 6 SBP: 131 mmHg
Stage 6 Speed: 0 mph
Stage 7 DBP: 62 mmHg
Stage 7 HR: 102 {beats}/min
Stage 7 SBP: 106 mmHg
TID: 1.07

## 2015-11-30 MED ORDER — TECHNETIUM TC 99M SESTAMIBI GENERIC - CARDIOLITE
32.6000 | Freq: Once | INTRAVENOUS | Status: AC | PRN
Start: 2015-11-30 — End: 2015-11-30
  Administered 2015-11-30: 33 via INTRAVENOUS

## 2015-11-30 MED ORDER — TECHNETIUM TC 99M SESTAMIBI GENERIC - CARDIOLITE
10.7000 | Freq: Once | INTRAVENOUS | Status: AC | PRN
  Administered 2015-11-30: 11 via INTRAVENOUS

## 2015-12-05 ENCOUNTER — Telehealth: Payer: Self-pay | Admitting: Cardiovascular Disease

## 2015-12-05 ENCOUNTER — Encounter: Payer: Self-pay | Admitting: *Deleted

## 2015-12-05 ENCOUNTER — Other Ambulatory Visit: Payer: Self-pay | Admitting: *Deleted

## 2015-12-05 DIAGNOSIS — Z006 Encounter for examination for normal comparison and control in clinical research program: Secondary | ICD-10-CM

## 2015-12-05 MED ORDER — AMBULATORY NON FORMULARY MEDICATION: 81.0000 mg | Freq: Every day | Status: DC

## 2015-12-05 NOTE — Progress Notes (Signed)
TWILIGHT Research 3 month Randomization visit completed. Patient was randomized to ASA  daily or PLACEBO. Patient states he has been compliant with medication and did come to hospital on 11/26/15 admitted and discharged in same day with chest pain negative enzymes. He has since had a stress test and awaiting results. He also states he thinks he has blood in his stools because his bowel movements are black. Encouraged patient to see primary physician, he does have an upcoming appointment and will address. Instructed patient to take only ASA out of study provided bottle because it may be placebo. Questions encouraged and answered. Schedule of research appointment windows given to patient, encouraged to call with any questions. Verbalizes understanding.

## 2015-12-05 NOTE — Telephone Encounter (Signed)
Would like his stress test results from 11-30-15 please.

## 2015-12-05 NOTE — Telephone Encounter (Signed)
I spoke with the pt and made him aware of myoview results.  The pt continues to complain of tightness and discomfort in his chest that comes and goes and extreme fatigue.  The fatigue has been new over the past week.  The pt feels like these symptoms are not the intensity of what he felt with MI. I made the pt aware that I will review this information with Dr Excell Seltzer.

## 2015-12-06 ENCOUNTER — Ambulatory Visit (INDEPENDENT_AMBULATORY_CARE_PROVIDER_SITE_OTHER): Payer: BLUE CROSS/BLUE SHIELD | Admitting: Cardiovascular Disease

## 2015-12-06 ENCOUNTER — Encounter: Payer: Self-pay | Admitting: Cardiovascular Disease

## 2015-12-06 VITALS — BP 104/60 | HR 83 | Ht 70.5 in | Wt 233.8 lb

## 2015-12-06 DIAGNOSIS — I25118 Atherosclerotic heart disease of native coronary artery with other forms of angina pectoris: Secondary | ICD-10-CM

## 2015-12-06 MED ORDER — OMEPRAZOLE 20 MG PO CPDR
20.0000 mg | DELAYED_RELEASE_CAPSULE | Freq: Every day | ORAL | Status: AC
Start: 2015-12-06 — End: ?

## 2015-12-06 NOTE — Telephone Encounter (Signed)
Discussed pt with Dr Excell Seltzer and pt scheduled for office visit today for further evaluation.

## 2015-12-06 NOTE — Patient Instructions (Signed)
Medication Instructions:  Your physician has recommended you make the following change in your medication:  1. Please take Prilosec  one tablet by mouth daily for 2 WEEKS--30 minutes prior to your morning meal  Labwork: No new orders.   Testing/Procedures: No new orders.   Follow-Up: Your physician recommends that you schedule a follow-up appointment in: 3 MONTHS with PA/NP  Your physician wants you to follow-up in: 6 MONTHS with Dr Excell Seltzer.  You will receive a reminder letter in the mail two months in advance. If you don't receive a letter, please call our office to schedule the follow-up appointment.    Any Other Special Instructions Will Be Listed Below (If Applicable).     If you need a refill on your cardiac medications before your next appointment, please call your pharmacy.

## 2015-12-06 NOTE — Progress Notes (Signed)
Cardiology Office Note Date:  12/06/2015   ID:  Chad Copeland, DOB 10/20/1970, MRN [id number]  PCP:  No PCP Per Patient  Cardiologist:  Tonny Bollman, MD    Chief Complaint  Patient presents with  . Chest Pain   History of Present Illness: Chad Copeland is a 45 y.o. male who presents for follow-up of CAD. He called in with complaints of chest pain and is worked in today. He was checked in the ER last week with chest pain and sent home after he ruled out from MI. He had a follow-up stress test demonstrated a fixed defect consistent with his prior infarct but no evidence of ischemia. His gated LVEF was 52%.  The patient continues to have chest discomfort on and off. There is no association with physical exertion. He had no angina documented on the treadmill at the time of his stress test. He describes a left-sided and central chest pressure that comes and goes. It is unaffected by nitroglycerin. He has been concerned about this and called in for a follow-up appointment. He complains of associated fatigue. Denies shortness of breath, heart palpitations, orthopnea, or PND. He has been compliant with his medications.     Past Medical History  Diagnosis Date  . Depression   . Anxiety   . Tobacco abuse 09/05/2015  . CAD (coronary artery disease)     cath 09/05/2015 100% OM1 stenosis treated with 3.5 x 16 mm Synergy DES    Past Surgical History  Procedure Laterality Date  . Cardiac catheterization N/A 09/05/2015    Procedure: Left Heart Cath and Coronary Angiography;  Surgeon: Corky Crafts, MD;  Location: [facility] INVASIVE CV LAB;  Service: Cardiovascular;  Laterality: N/A;  . Cardiac catheterization  09/05/2015    Procedure: Coronary Stent Intervention;  Surgeon: Corky Crafts, MD;  Location: [facility] INVASIVE CV LAB;  Service: Cardiovascular;;    Current Outpatient Prescriptions  Medication Sig Dispense Refill  . AMBULATORY NON FORMULARY MEDICATION Take 90 mg by mouth 2 (two)  times daily. Medication Name: BRILINTA 90 mg BID (TWILIGHT Research Study provided)    . AMBULATORY NON FORMULARY MEDICATION Take 81 mg by mouth daily. Medication Name: ASA 81 mg daily or PLACEBO (TWILIGHT Research study provided do not take any open label ASA)    . atorvastatin (LIPITOR) 80 MG tablet Take 1 tablet (80 mg total) by mouth daily at 6 PM. 30 tablet 11  . carvedilol (COREG) 3.125 MG tablet Take 1 tablet (3.125 mg total) by mouth 2 (two) times daily. 60 tablet 11  . lisinopril (PRINIVIL,ZESTRIL) 5 MG tablet Take 1 tablet (5 mg total) by mouth daily. 30 tablet 11  . nitroGLYCERIN (NITROSTAT) 0.4 MG SL tablet Place 1 tablet (0.4 mg total) under the tongue every 5 (five) minutes x 3 doses as needed for chest pain. 25 tablet 3  . omeprazole (PRILOSEC) 20 MG capsule Take 1 capsule (20 mg total) by mouth daily. 1 capsule 0   No current facility-administered medications for this visit.    Allergies:   Review of patient's allergies indicates no known allergies.   Social History:  The patient  reports that he quit smoking about 2 months ago. His smoking use included Cigarettes. He smoked 1.00 pack per day. He has never used smokeless tobacco. He reports that he drinks about 3.6 oz of alcohol per week. He reports that he does not use illicit drugs.   Family History:  The patient's  family history includes CAD in his  maternal grandfather; Coronary artery disease (age of onset: 31) in his paternal grandfather; Depression in his mother; Diabetes type II in his father; Heart attack in his paternal uncle; Hyperlipidemia in his father; Hypertension in his father.    ROS:  Please see the history of present illness.  Otherwise, review of systems is positive for chest pain, anxiety.  All other systems are reviewed and negative.    PHYSICAL EXAM: VS:  BP 104/60 mmHg  Pulse 83  Ht 5' 10.5" (1.791 m)  Wt 106.051 kg (233 lb 12.8 oz)  BMI 33.06 kg/m2 , BMI Body mass index is 33.06 kg/(m^2). GEN: Well  nourished, well developed, overweight male in no acute distress HEENT: normal Neck: no JVD, no masses. No carotid bruits Cardiac: RRR without murmur or gallop                Respiratory:  clear to auscultation bilaterally, normal work of breathing GI: soft, nontender, nondistended, + BS MS: no deformity or atrophy Ext: no pretibial edema, pedal pulses 2+= bilaterally Skin: warm and dry, no rash Neuro:  Strength and sensation are intact Psych: euthymic mood, full affect  EKG:  EKG is not ordered today. Recent EKG from the emergency department is reviewed. This demonstrated normal sinus rhythm with an age-indeterminate posterior infarct and no acute ST-T segment changes  Recent Labs: 09/05/2015: B Natriuretic Peptide 15.8 09/07/2015: Magnesium 2.2 11/26/2015: BUN 15; Creatinine, Ser 1.02; Hemoglobin 12.4*; Platelets 242; Potassium 3.8; Sodium 135   Lipid Panel     Component Value Date/Time   CHOL 103 11/26/2015 0749   TRIG 54 11/26/2015 0749   HDL 48 11/26/2015 0749   CHOLHDL 2.1 11/26/2015 0749   VLDL 11 11/26/2015 0749   LDLCALC 44 11/26/2015 0749      Wt Readings from Last 3 Encounters:  12/06/15 106.051 kg (233 lb 12.8 oz)  11/26/15 106.459 kg (234 lb 11.2 oz)  09/14/15 99.61 kg (219 lb 9.6 oz)     Cardiac Studies Reviewed: Myoview Scan Study Highlights     Nuclear stress EF: 52%.  There was no ST segment deviation noted during stress.  There is a medium defect of moderate severity present in the basal inferolateral, basal anterolateral, mid inferolateral and mid anterolateral location. The defect is non-reversible consistent with prior infarct.  This is a low risk study.  The left ventricular ejection fraction is mildly decreased (45-54%). There is akinesis of the mid and apical inferolateral wall.     ASSESSMENT AND PLAN: 1.  Chest pain: Typical and atypical features. Myoview showed no evidence of ischemia and was interpreted as a low risk study. LVEF is  preserved. Based on the fact that there is no ischemia on his stress test, his chest pain is nonexertional, symptoms are not similar to those of his MI, and there is no response to nitroglycerin, my suspicion is that this is noncardiac chest pain. I recommended a trial of Prilosec OTC for 2 weeks. There also may be an anxiety component and he plans to discuss this with his primary care physician. Will arrange follow-up in about 3 months. He was advised to seek immediate medical attention if there is progressive chest pain. He continues on antiplatelet therapy per the [facility] research study protocol.  2. Hyperlipidemia: Treated with high-dose atorvastatin.  3. Essential hypertension: Blood pressure is well controlled on a combination of low-dose carvedilol and lisinopril.  Current medicines are reviewed with the patient today.  The patient does not have concerns regarding medicines.  Labs/ tests ordered today include:  No orders of the defined types were placed in this encounter.    Disposition:   FU 3 months  Signed, Tonny Bollman, MD  12/06/2015 4:14 PM    [facility] [address] [place], [place], [place]  [address] Phone: [phone]; Fax: [phone]

## 2015-12-25 ENCOUNTER — Other Ambulatory Visit (HOSPITAL_COMMUNITY): Payer: Self-pay

## 2015-12-25 ENCOUNTER — Other Ambulatory Visit: Payer: Self-pay

## 2015-12-29 ENCOUNTER — Ambulatory Visit: Payer: Self-pay | Admitting: Cardiovascular Disease

## 2016-01-09 ENCOUNTER — Telehealth: Payer: Self-pay | Admitting: *Deleted

## 2016-01-09 NOTE — Telephone Encounter (Signed)
Left message for patient to return call to research office for his 4 month telephone follow up.

## 2016-01-16 ENCOUNTER — Encounter: Payer: Self-pay | Admitting: *Deleted

## 2016-01-16 DIAGNOSIS — Z006 Encounter for examination for normal comparison and control in clinical research program: Secondary | ICD-10-CM

## 2016-01-16 NOTE — Progress Notes (Signed)
Unsuccessful in reaching patient via phone for TWILIGHT 4 month follow up visit. I sent the following email to patient ([email or web address]) Hey Chad Copeland,     This is Tammy Armed forces training and education officer(Research Nurse that supplies your Brilinta ) I have been unsuccessful in reaching you by phone. I hope you are well! Your next follow up window is 05/02/16-07/01/16. We will try to reach you to schedule this visit a little closer to that window. If you have any problems please do not Hesitate to contact the research Office.  thanks  Amelia Joammy Hedrick, RN Clinical Research Coordinator [facility] Office: [phone] Cell:    [phone] Fax:     [phone] [email or web address]

## 2016-01-17 ENCOUNTER — Telehealth: Payer: Self-pay | Admitting: *Deleted

## 2016-01-17 NOTE — Telephone Encounter (Signed)
Received call form patient that he received my email. He is doing fine and has not had any bleeding or adverse events. Patient apologized for not returning calls , he states he has been working. Questions encouraged and answered.

## 2016-03-04 ENCOUNTER — Ambulatory Visit (INDEPENDENT_AMBULATORY_CARE_PROVIDER_SITE_OTHER): Payer: BLUE CROSS/BLUE SHIELD | Admitting: Cardiology

## 2016-03-04 ENCOUNTER — Encounter: Payer: Self-pay | Admitting: Cardiology

## 2016-03-04 VITALS — BP 110/74 | HR 86 | Ht 70.5 in | Wt 242.0 lb

## 2016-03-04 DIAGNOSIS — I2510 Atherosclerotic heart disease of native coronary artery without angina pectoris: Secondary | ICD-10-CM | POA: Diagnosis not present

## 2016-03-04 NOTE — Progress Notes (Signed)
03/04/2016 Chad NaasMichael Dorsainvil   August 02, 1971  [id number]  Primary Physician No PCP Per Patient Primary Cardiologist: Dr. Excell Seltzerooper   Reason for Visit/CC: 3 month f/u for atypical CP.   HPI:  45 y/o male, followed by Dr. Excell Seltzerooper, with a h/o CAD s/p MI 08/2015 with cath showing a 100% occluded OM1 s/p PCI + DES. Also with h/o tobacco abuse and anxiety. He was seen by Dr. Excell Seltzerooper 3 months ago in Feb and complained of atypical CP. Dr. Copper ordered a NST which showed a fixed defect consistent with his prior infarct but no evidence of ischemia. His gated LVEF was 52%. Based on symptoms and stress test findings, Dr. Excell Seltzerooper felt that is CP was non cardiac in nature. He felt possibly axniety vs GI components were contributing. He prescribed prilosec x 2 weeks and instructed him to f/u in 3 months.   He reports that he has done well. No recurrent CP. He reports full medication compliance with ASA + Brilinta (enrolled in [facility] trial). No abnormal bleeding. He has been fully compliant with his statin, BB and ACE-I. He no longer smokes.      Current Outpatient Prescriptions  Medication Sig Dispense Refill  . AMBULATORY NON FORMULARY MEDICATION Take 90 mg by mouth 2 (two) times daily. Medication Name: BRILINTA 90 mg BID (TWILIGHT Research Study provided)    . AMBULATORY NON FORMULARY MEDICATION Take 81 mg by mouth daily. Medication Name: ASA 81 mg daily or PLACEBO (TWILIGHT Research study provided do not take any open label ASA)    . atorvastatin (LIPITOR) 80 MG tablet Take 1 tablet (80 mg total) by mouth daily at 6 PM. 30 tablet 11  . carvedilol (COREG) 3.125 MG tablet Take 1 tablet (3.125 mg total) by mouth 2 (two) times daily. 60 tablet 11  . lisinopril (PRINIVIL,ZESTRIL) 5 MG tablet Take 1 tablet (5 mg total) by mouth daily. 30 tablet 11  . nitroGLYCERIN (NITROSTAT) 0.4 MG SL tablet Place 1 tablet (0.4 mg total) under the tongue every 5 (five) minutes x 3 doses as needed for chest pain. 25 tablet 3  .  omeprazole (PRILOSEC) 20 MG capsule Take 1 capsule (20 mg total) by mouth daily. 1 capsule 0   No current facility-administered medications for this visit.    No Known Allergies  Social History   Social History  . Marital Status: Divorced    Spouse Name: N/A  . Number of Children: N/A  . Years of Education: N/A   Occupational History  . Estate manager/land agentHotel Manager    Social History Main Topics  . Smoking status: Former Smoker -- 1.00 packs/day    Types: Cigarettes    Quit date: 09/07/2015  . Smokeless tobacco: Never Used  . Alcohol Use: 3.6 oz/week    6 Cans of beer per week  . Drug Use: No  . Sexual Activity: Not on file   Other Topics Concern  . Not on file   Social History Narrative   The patient works in [employer]. He is a one pack per day smoker for many years. He drinks alcohol on occasion. No illicit drug use.     Review of Systems: General: negative for chills, fever, night sweats or weight changes.  Cardiovascular: negative for chest pain, dyspnea on exertion, edema, orthopnea, palpitations, paroxysmal nocturnal dyspnea or shortness of breath Dermatological: negative for rash Respiratory: negative for cough or wheezing Urologic: negative for hematuria Abdominal: negative for nausea, vomiting, diarrhea, bright red blood per rectum, melena, or  hematemesis Neurologic: negative for visual changes, syncope, or dizziness All other systems reviewed and are otherwise negative except as noted above.    Blood pressure 110/74, pulse 86, height 5' 10.5" (1.791 m), weight 242 lb (109.77 kg).  General appearance: alert, cooperative and no distress Neck: no carotid bruit and no JVD Lungs: clear to auscultation bilaterally Heart: regular rate and rhythm, S1, S2 normal, no murmur, click, rub or gallop Extremities: no LEE Pulses: 2+ and symmetric Skin: warm and dry Neurologic: Grossly normal  EKG not performed  ASSESSMENT AND PLAN:   1. CAD: s/p MI 08/2015 s/p PCI + DES  to OM1. Nonischemic stress test 2/17. EF normal. He denies any recurrent CP. Continue DAPT with ASA + Brilinta, high dose stain, BB and ACE-I.  2. HLD: LDL well controlled with Lipitor. LP 2/17 showed LDL to be at goal of <70 and 44 mg/dL.   3. Former Tobacco User: patient no longer smokes.   PLAN  Keep f/u with Dr. Excell Seltzer 05/2016.   Robbie Lis [facility] 03/04/2016 9:43 AM

## 2016-03-04 NOTE — Patient Instructions (Signed)
Medication Instructions:  Your physician recommends that you continue on your current medications as directed. Please refer to the Current Medication list given to you today.   Labwork: None ordered  Testing/Procedures: None ordererd  Follow-Up: Your physician recommends that you schedule a follow-up appointment in:   SEE DR. Excell SeltzerOOPER AS PLANNED   Any Other Special Instructions Will Be Listed Below (If Applicable).     If you need a refill on your cardiac medications before your next appointment, please call your pharmacy.

## 2016-03-26 ENCOUNTER — Other Ambulatory Visit (INDEPENDENT_AMBULATORY_CARE_PROVIDER_SITE_OTHER): Payer: BLUE CROSS/BLUE SHIELD

## 2016-03-26 ENCOUNTER — Ambulatory Visit (INDEPENDENT_AMBULATORY_CARE_PROVIDER_SITE_OTHER): Payer: BLUE CROSS/BLUE SHIELD | Admitting: Family

## 2016-03-26 ENCOUNTER — Encounter: Payer: Self-pay | Admitting: Family

## 2016-03-26 VITALS — BP 108/82 | HR 83 | Temp 98.2°F | Resp 16 | Ht 70.0 in | Wt 245.0 lb

## 2016-03-26 DIAGNOSIS — F418 Other specified anxiety disorders: Secondary | ICD-10-CM

## 2016-03-26 DIAGNOSIS — I2510 Atherosclerotic heart disease of native coronary artery without angina pectoris: Secondary | ICD-10-CM | POA: Diagnosis not present

## 2016-03-26 DIAGNOSIS — F329 Major depressive disorder, single episode, unspecified: Secondary | ICD-10-CM | POA: Insufficient documentation

## 2016-03-26 DIAGNOSIS — F419 Anxiety disorder, unspecified: Secondary | ICD-10-CM

## 2016-03-26 DIAGNOSIS — R5383 Other fatigue: Secondary | ICD-10-CM

## 2016-03-26 DIAGNOSIS — E669 Obesity, unspecified: Secondary | ICD-10-CM | POA: Insufficient documentation

## 2016-03-26 DIAGNOSIS — F32A Depression, unspecified: Secondary | ICD-10-CM

## 2016-03-26 LAB — IBC PANEL
IRON: 126 ug/dL (ref 42–165)
Saturation Ratios: 35.6 % (ref 20.0–50.0)
TRANSFERRIN: 253 mg/dL (ref 212.0–360.0)

## 2016-03-26 LAB — COMPREHENSIVE METABOLIC PANEL
ALBUMIN: 4.4 g/dL (ref 3.5–5.2)
ALT: 31 U/L (ref 0–53)
AST: 27 U/L (ref 0–37)
Alkaline Phosphatase: 53 U/L (ref 39–117)
BUN: 12 mg/dL (ref 6–23)
CALCIUM: 9.6 mg/dL (ref 8.4–10.5)
CHLORIDE: 105 meq/L (ref 96–112)
CO2: 26 meq/L (ref 19–32)
CREATININE: 1.1 mg/dL (ref 0.40–1.50)
GFR: 76.7 mL/min (ref >60.00)
Glucose, Bld: 86 mg/dL (ref 70–99)
POTASSIUM: 4.4 meq/L (ref 3.5–5.1)
Sodium: 139 mEq/L (ref 135–145)
Total Bilirubin: 0.4 mg/dL (ref 0.2–1.2)
Total Protein: 7.9 g/dL (ref 6.0–8.3)

## 2016-03-26 LAB — B12 AND FOLATE PANEL
Folate: 6.6 ng/mL (ref >5.9)
VITAMIN B 12: 281 pg/mL (ref 211–911)

## 2016-03-26 LAB — CBC
HEMATOCRIT: 38.9 % — AB (ref 39.0–52.0)
HEMOGLOBIN: 13.4 g/dL (ref 13.0–17.0)
MCHC: 34.4 g/dL (ref 30.0–36.0)
MCV: 94.8 fl (ref 78.0–100.0)
PLATELETS: 236 10*3/uL (ref 150.0–400.0)
RBC: 4.1 Mil/uL — AB (ref 4.22–5.81)
RDW: 13.3 % (ref 11.5–15.5)
WBC: 7.3 10*3/uL (ref 4.0–10.5)

## 2016-03-26 LAB — TESTOSTERONE: TESTOSTERONE: 251.93 ng/dL — AB (ref 300.00–890.00)

## 2016-03-26 LAB — HEMOGLOBIN A1C: Hgb A1c MFr Bld: 5.9 % (ref 4.6–6.5)

## 2016-03-26 LAB — TSH: TSH: 0.73 u[IU]/mL (ref 0.35–4.50)

## 2016-03-26 NOTE — Assessment & Plan Note (Signed)
BMI of 35.15 indicating obesity. Recommend weight loss approximate 5-10% of current body weight through nutrition and physical activity changes. Recommend increasing physical activity to 30 minutes of moderate level activity daily. Encourage nutritional intake that focuses on nutrient dense foods and is moderate, varied, and balanced and is low in saturated fats and processed/sugary foods. Continue to monitor.

## 2016-03-26 NOTE — Progress Notes (Signed)
Subjective:    Patient ID: Chad Copeland, male    DOB: 1971/04/14, 45 y.o.   MRN: [id number]  Chief Complaint  Patient presents with  . Establish Care    x4 months has had fatigue and low energy, had a heart attack last november and since has had some cognitive issues, would like to see about getting a referral to psychiatrist for anxiety issues    HPI:  Chad Copeland is a 45 y.o. male who  has a past medical history of Depression; Anxiety; Tobacco abuse (09/05/2015); CAD (coronary artery disease); Heart attack (HCC); GERD (gastroesophageal reflux disease); and Allergy. and presents today for an office visit to establish care.   1.) Coronary artery disease - Status post MI in 08/2015 with cardiac cath showing 100% occulded OM1 s/p PCI + DES. Currently maintained on Brilinta and aspirin for anticoagulation. Also maintained on lisinopril, carvediolol, and atorvastatin for CAD risk reduction which he reports taking as prescribed without adverse side effects. Reports that he has been experiencing fatigue and low energy that has been going on for about 4 months. Course of the symptoms has improved slightly.  2.) Anxiety - Previously diagnosed with anxiety. Not currently maintained on medication and has recently had the associated symptoms of increased anxiety and has noted decreased drive and motivation and then becomes anxious when things that he should have gotten done are not done. Has previously been on Cymbalta in the past and is not currently interested in being medicated at the present. Denies any modifying factors/treatments attempted that make it better. Also notes that he has some changes in though process where he will occasionally will become distracted.    No Known Allergies   Outpatient Prescriptions Prior to Visit  Medication Sig Dispense Refill  . AMBULATORY NON FORMULARY MEDICATION Take 90 mg by mouth 2 (two) times daily. Medication Name: BRILINTA 90 mg BID (TWILIGHT Research  Study provided)    . AMBULATORY NON FORMULARY MEDICATION Take 81 mg by mouth daily. Medication Name: ASA 81 mg daily or PLACEBO (TWILIGHT Research study provided do not take any open label ASA)    . atorvastatin (LIPITOR) 80 MG tablet Take 1 tablet (80 mg total) by mouth daily at 6 PM. 30 tablet 11  . carvedilol (COREG) 3.125 MG tablet Take 1 tablet (3.125 mg total) by mouth 2 (two) times daily. 60 tablet 11  . lisinopril (PRINIVIL,ZESTRIL) 5 MG tablet Take 1 tablet (5 mg total) by mouth daily. 30 tablet 11  . nitroGLYCERIN (NITROSTAT) 0.4 MG SL tablet Place 1 tablet (0.4 mg total) under the tongue every 5 (five) minutes x 3 doses as needed for chest pain. 25 tablet 3  . omeprazole (PRILOSEC) 20 MG capsule Take 1 capsule (20 mg total) by mouth daily. 1 capsule 0   No facility-administered medications prior to visit.     Past Medical History  Diagnosis Date  . Depression   . Anxiety   . Tobacco abuse 09/05/2015  . CAD (coronary artery disease)     cath 09/05/2015 100% OM1 stenosis treated with 3.5 x 16 mm Synergy DES  . Heart attack (HCC)   . GERD (gastroesophageal reflux disease)   . Allergy      Past Surgical History  Procedure Laterality Date  . Cardiac catheterization N/A 09/05/2015    Procedure: Left Heart Cath and Coronary Angiography;  Surgeon: Corky Crafts, MD;  Location[facility] INVASIVE CV LAB;  Service: Cardiovascular;  Laterality: N/A;  . Cardiac catheterization  09/05/2015  Procedure: Coronary Stent Intervention;  Surgeon: Corky CraftsJayadeep S Varanasi, MD;  [facility];  Service: Cardiovascular;;     Family History  Problem Relation Age of Onset  . Coronary artery disease Paternal Grandfather [phone]  . Lung cancer Paternal Grandfather   . Depression Mother   . Diabetes type II Father   . Hyperlipidemia Father   . Hypertension Father   . Heart attack Paternal Uncle   . CAD Maternal Grandfather   . Alzheimer's disease Paternal Grandmother      Social  History   Social History  . Marital Status: Divorced    Spouse Name: N/A  . Number of Children: 0  . Years of Education: 16   Occupational History  . Estate manager/land agentHotel Manager    Social History Main Topics  . Smoking status: Former Smoker -- 1.00 packs/day for 30 years    Types: Cigarettes    Quit date: 09/07/2015  . Smokeless tobacco: Never Used  . Alcohol Use: 3.6 oz/week    6 Cans of beer per week  . Drug Use: No  . Sexual Activity: Not on file   Other Topics Concern  . Not on file   Social History Narrative   Fun: Travel     Review of Systems  Constitutional: Positive for fatigue. Negative for fever and chills.  Eyes:       Negative for changes in vision  Respiratory: Negative for cough, chest tightness and wheezing.   Cardiovascular: Negative for chest pain, palpitations and leg swelling.  Endocrine: Negative for polydipsia, polyphagia and polyuria.  Neurological: Negative for dizziness, weakness, light-headedness and headaches.      Objective:    BP 108/82 mmHg  Pulse 83  Temp(Src) 98.2 F (36.8 C) (Oral)  Resp 16  Ht 5\' 10"  (1.778 m)  Wt 245 lb (111.131 kg)  BMI 35.15 kg/m2  SpO2 97% Nursing note and vital signs reviewed.  Physical Exam  Constitutional: He is oriented to person, place, and time. He appears well-developed and well-nourished. No distress.  Cardiovascular: Normal rate, regular rhythm, normal heart sounds and intact distal pulses.   Pulmonary/Chest: Effort normal and breath sounds normal.  Neurological: He is alert and oriented to person, place, and time.  Skin: Skin is warm and dry.  Psychiatric: He has a normal mood and affect. His behavior is normal. Judgment and thought content normal.       Assessment & Plan:   Problem List Items Addressed This Visit      Cardiovascular and Mediastinum   CAD (coronary artery disease)    Coronary artery disease appears stable with current regimen and maintained on appropriate beta blocker and  angiotensin-converting enzyme for CAD risk reduction. No chest pain today. Continue to monitor risk factors to reduce risk for coronary artery disease in the future. Continue to monitor and changes per cardiology.        Other   Fatigue - Primary    Symptoms of fatigue with possible origins including metabolic, cardiovascular, or psychologic. Obtain B12/folate, CBC, complete metabolic profile, A1c, IBC panel, testosterone, and TSH to rule out metabolic causes. Cannot rule out medication, anxiety or depression, or exacerbation of his coronary artery disease recovery. Continue to monitor pending blood work results.      Relevant Orders   CBC   Comprehensive metabolic panel   TSH   Testosterone   B12 and Folate Panel   IBC panel   Hemoglobin A1c   Anxiety and depression    Symptoms  and exam consistent with possible depression and exacerbation of anxiety. Discussed importance of stress and stress management given his recent cessation of smoking. Patient requests no medication at this time. Referral to psychology place for possible cognitive behavioral therapy. Denies suicidal ideation. Follow-up pending referral.      Relevant Orders   Ambulatory referral to Psychology   Obesity    BMI of 35.15 indicating obesity. Recommend weight loss approximate 5-10% of current body weight through nutrition and physical activity changes. Recommend increasing physical activity to 30 minutes of moderate level activity daily. Encourage nutritional intake that focuses on nutrient dense foods and is moderate, varied, and balanced and is low in saturated fats and processed/sugary foods. Continue to monitor.           I am having Mr. Hink maintain his nitroGLYCERIN, atorvastatin, lisinopril, AMBULATORY NON FORMULARY MEDICATION, carvedilol, AMBULATORY NON FORMULARY MEDICATION, and omeprazole.   Follow-up: Return in about 1 month (around 04/25/2016).  Jeanine Luz, FNP

## 2016-03-26 NOTE — Assessment & Plan Note (Signed)
Symptoms and exam consistent with possible depression and exacerbation of anxiety. Discussed importance of stress and stress management given his recent cessation of smoking. Patient requests no medication at this time. Referral to psychology place for possible cognitive behavioral therapy. Denies suicidal ideation. Follow-up pending referral.

## 2016-03-26 NOTE — Assessment & Plan Note (Signed)
Symptoms of fatigue with possible origins including metabolic, cardiovascular, or psychologic. Obtain B12/folate, CBC, complete metabolic profile, A1c, IBC panel, testosterone, and TSH to rule out metabolic causes. Cannot rule out medication, anxiety or depression, or exacerbation of his coronary artery disease recovery. Continue to monitor pending blood work results.

## 2016-03-26 NOTE — Assessment & Plan Note (Signed)
Coronary artery disease appears stable with current regimen and maintained on appropriate beta blocker and angiotensin-converting enzyme for CAD risk reduction. No chest pain today. Continue to monitor risk factors to reduce risk for coronary artery disease in the future. Continue to monitor and changes per cardiology.

## 2016-03-26 NOTE — Patient Instructions (Signed)
Thank you for choosing [facility].  Summary/Instructions:  Please continue to take your medication as prescribed.   Please stop by the lab on the basement level of the building for your blood work. Your results will be released to MyChart (or called to you) after review, usually within 72 hours after test completion. If any changes need to be made, you will be notified at that same time.  If your symptoms worsen or fail to improve, please contact our office for further instruction, or in case of emergency go directly to the emergency room at the closest medical facility.   Fatigue Fatigue is feeling tired all of the time, a lack of energy, or a lack of motivation. Occasional or mild fatigue is often a normal response to activity or life in general. However, long-lasting (chronic) or extreme fatigue may indicate an underlying medical condition. HOME CARE INSTRUCTIONS  Watch your fatigue for any changes. The following actions may help to lessen any discomfort you are feeling:  Talk to your health care provider about how much sleep you need each night. Try to get the required amount every night.  Take medicines only as directed by your health care provider.  Eat a healthy and nutritious diet. Ask your health care provider if you need help changing your diet.  Drink enough fluid to keep your urine clear or pale yellow.  Practice ways of relaxing, such as yoga, meditation, massage therapy, or acupuncture.  Exercise regularly.   Change situations that cause you stress. Try to keep your work and personal routine reasonable.  Do not abuse illegal drugs.  Limit alcohol intake to no more than 1 drink per day for nonpregnant women and 2 drinks per day for men. One drink equals 12 ounces of beer, 5 ounces of wine, or 1 ounces of hard liquor.  Take a multivitamin, if directed by your health care provider. SEEK MEDICAL CARE IF:   Your fatigue does not get better.  You have a fever.    You have unintentional weight loss or gain.  You have headaches.   You have difficulty:   Falling asleep.  Sleeping throughout the night.  You feel angry, guilty, anxious, or sad.   You are unable to have a bowel movement (constipation).   You skin is dry.   Your legs or another part of your body is swollen.  SEEK IMMEDIATE MEDICAL CARE IF:   You feel confused.   Your vision is blurry.  You feel faint or pass out.   You have a severe headache.   You have severe abdominal, pelvic, or back pain.   You have chest pain, shortness of breath, or an irregular or fast heartbeat.   You are unable to urinate or you urinate less than normal.   You develop abnormal bleeding, such as bleeding from the rectum, vagina, nose, lungs, or nipples.  You vomit blood.   You have thoughts about harming yourself or committing suicide.   You are worried that you might harm someone else.    This information is not intended to replace advice given to you by your health care provider. Make sure you discuss any questions you have with your health care provider.   Document Released: 07/28/2007 Document Revised: 10/21/2014 Document Reviewed: 02/01/2014 Elsevier Interactive Patient Education [employer]

## 2016-03-26 NOTE — Progress Notes (Signed)
Pre visit review using our clinic review tool, if applicable. No additional management support is needed unless otherwise documented below in the visit note. 

## 2016-06-14 ENCOUNTER — Telehealth: Payer: Self-pay | Admitting: *Deleted

## 2016-06-14 NOTE — Telephone Encounter (Signed)
Multiple unsuccessful telephone attempts to reach patient to schedule 9 month TWILIGHT Follow up appointment to receive more Brilinta and Study drug (ASA/Placebo). Dates calls were made 05/07/16;05/13/16;05/17/16;05/30/16;06/14/16. Letter mailed 05/30/16 to contact research office.

## 2016-07-02 ENCOUNTER — Telehealth: Payer: Self-pay | Admitting: *Deleted

## 2016-07-02 NOTE — Telephone Encounter (Signed)
Telephone call to patient informing him the importance of Brilinta and potential Adverse events (including stent closure) if he missed dosing. Currently he now Out of window to receive more Free Brilinta and should be out of Brilinta. Research office has attempted several times to reach patient via phone, email and letter mailed to his residence. Left research office contact number.

## 2016-07-17 ENCOUNTER — Encounter: Payer: Self-pay | Admitting: *Deleted

## 2016-07-17 DIAGNOSIS — Z006 Encounter for examination for normal comparison and control in clinical research program: Secondary | ICD-10-CM

## 2016-07-17 NOTE — Progress Notes (Signed)
Certified Letter mailed today: July 17, 2016   Reginia NaasMichael Childers [address] . [place], [place] [id number]  Reference:  [facility] Appointment [facility]  Dear Mr. Lattie CornsMorse,  I hope you are doing well.  I am sending you this letter because I have had trouble reaching you via phone. We need to follow up with you for the [facility] study.  I attempted to contact you at [phone] number [phone].    Please call me at [phone]. This will allow us to follow you as the research study deemed necessary. Thank you and we appreciate you for your participation in this very important research study trial.  You    Sincerely,    Amelia Joammy Hedrick, RN Registered Nurse

## 2016-07-19 IMAGING — DX DG CHEST 2V
2 series · 2 of 2 positions shown · non-contrast
Comparison: Chest radiograph performed earlier today at [DATE] a.m.

CLINICAL DATA: Acute onset of cough and shortness of breath.
Generalized chest pain. Follow-up chest radiograph suggested to
assess for airspace opacification. Initial encounter.

EXAM:
CHEST  2 VIEW

[chest pa]
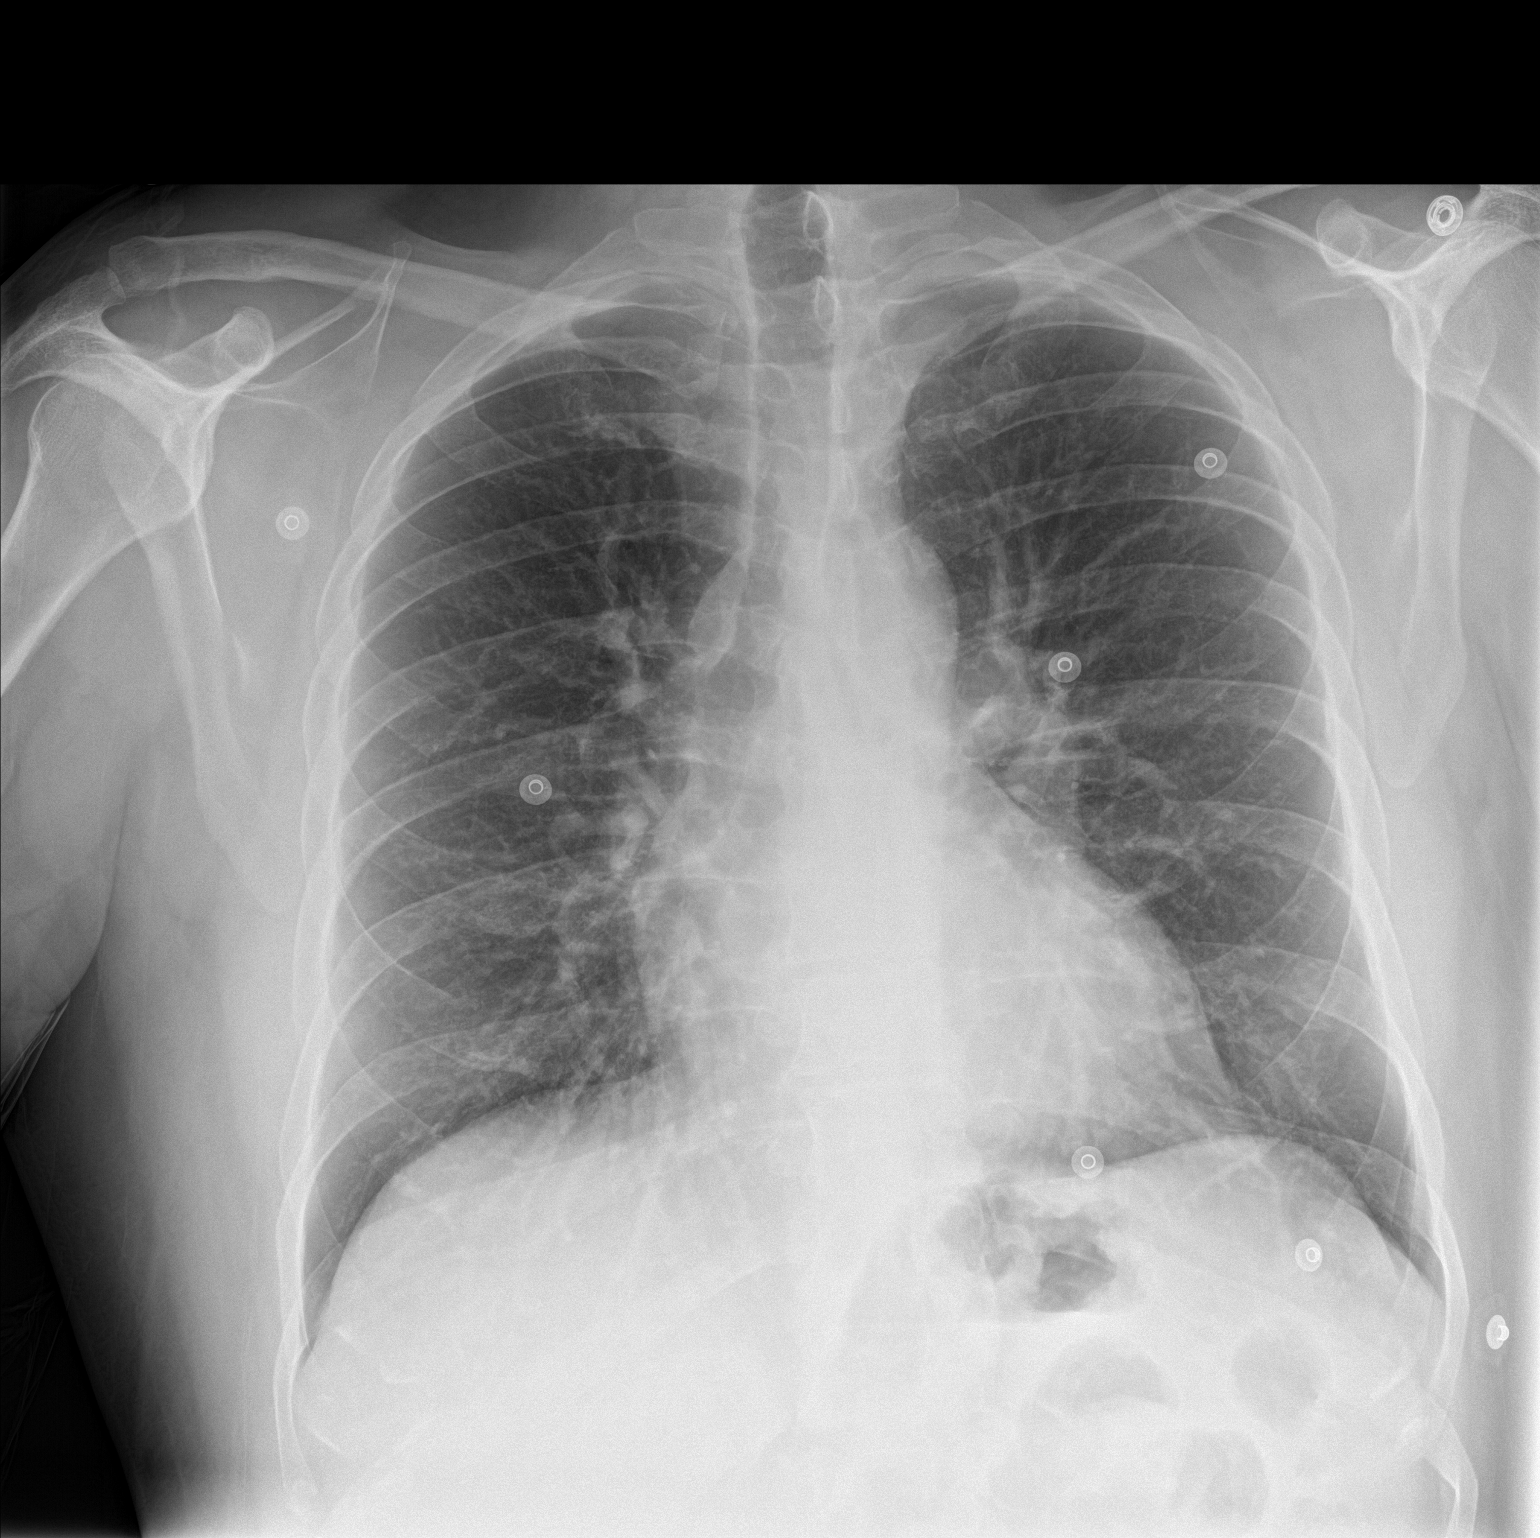

[chest lat]
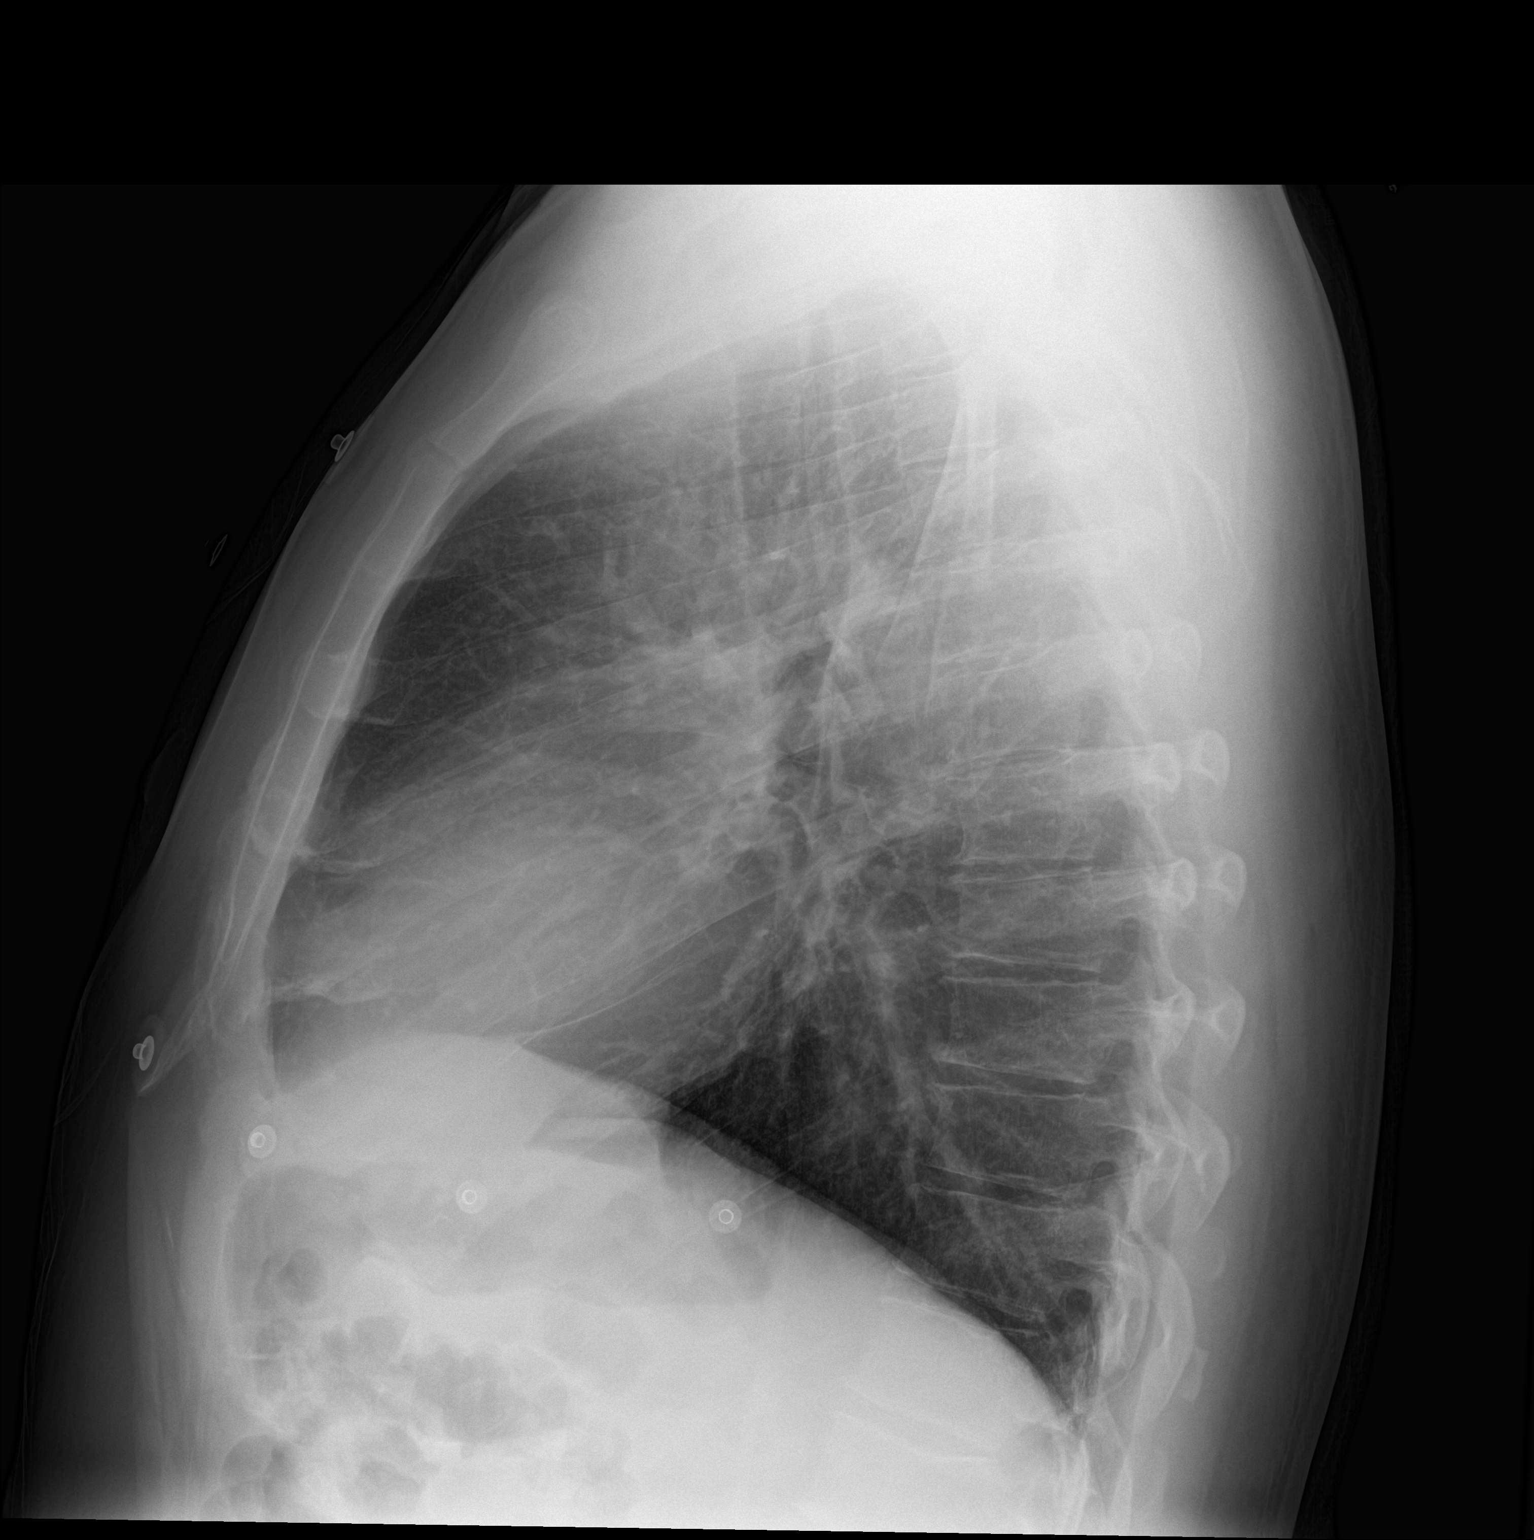

[2 of 2 positions shown; findings below may reference images not displayed]

FINDINGS: The lungs are well-aerated and clear. There is no evidence of focal
opacification, pleural effusion or pneumothorax.

The heart is normal in size; the mediastinal contour is within
normal limits. No acute osseous abnormalities are seen.
IMPRESSION: No acute cardiopulmonary process seen.

## 2016-10-03 ENCOUNTER — Other Ambulatory Visit: Payer: Self-pay | Admitting: *Deleted

## 2017-01-03 ENCOUNTER — Telehealth: Payer: Self-pay | Admitting: *Deleted

## 2017-01-03 NOTE — Telephone Encounter (Signed)
Unsuccessful attempt to reach patient via phone for the TWILIGHT research study. Left message for patient to call research office and verbalized the importance of Brilinta and was checking to see if he was getting this medication from a provider due to the fact that we cannot reach him. Also stressed the importance of this medication.

## 2017-03-20 ENCOUNTER — Telehealth: Payer: Self-pay | Admitting: *Deleted

## 2017-03-20 NOTE — Telephone Encounter (Signed)
Left message with Research office number to contact us with patients new contact info

## 2018-04-22 ENCOUNTER — Telehealth: Payer: Self-pay | Admitting: *Deleted

## 2018-04-22 NOTE — Telephone Encounter (Signed)
Certified letter mailed.
